# Patient Record
Sex: Female | Born: 1980 | Race: White | Hispanic: No | Marital: Married | Smoking: Current every day smoker
Health system: Southern US, Community
[De-identification: ages and names within clinical notes are randomized; demographics above are authoritative.]

## PROBLEM LIST (undated history)

## (undated) DIAGNOSIS — K219 Gastro-esophageal reflux disease without esophagitis: Secondary | ICD-10-CM

## (undated) DIAGNOSIS — F319 Bipolar disorder, unspecified: Secondary | ICD-10-CM

## (undated) DIAGNOSIS — F909 Attention-deficit hyperactivity disorder, unspecified type: Secondary | ICD-10-CM

## (undated) DIAGNOSIS — F419 Anxiety disorder, unspecified: Secondary | ICD-10-CM

## (undated) DIAGNOSIS — R519 Headache, unspecified: Secondary | ICD-10-CM

## (undated) DIAGNOSIS — G2581 Restless legs syndrome: Secondary | ICD-10-CM

## (undated) DIAGNOSIS — M1990 Unspecified osteoarthritis, unspecified site: Secondary | ICD-10-CM

## (undated) DIAGNOSIS — I10 Essential (primary) hypertension: Secondary | ICD-10-CM

## (undated) DIAGNOSIS — F603 Borderline personality disorder: Secondary | ICD-10-CM

## (undated) HISTORY — PX: ABDOMINAL HYSTERECTOMY: SHX81

## (undated) HISTORY — PX: CYST REMOVAL HAND: SHX6279

---

## 2004-10-02 ENCOUNTER — Emergency Department: Payer: Self-pay | Admitting: Emergency Medicine

## 2004-10-03 ENCOUNTER — Ambulatory Visit: Payer: Self-pay | Admitting: Emergency Medicine

## 2006-07-24 ENCOUNTER — Ambulatory Visit: Payer: Self-pay | Admitting: Surgery

## 2006-07-30 ENCOUNTER — Ambulatory Visit: Payer: Self-pay | Admitting: Specialist

## 2009-01-02 ENCOUNTER — Emergency Department (HOSPITAL_COMMUNITY): Admission: EM | Admit: 2009-01-02 | Discharge: 2009-01-02 | Payer: Self-pay | Admitting: Emergency Medicine

## 2009-02-01 ENCOUNTER — Inpatient Hospital Stay: Payer: Self-pay | Admitting: Unknown Physician Specialty

## 2010-02-07 ENCOUNTER — Observation Stay: Payer: Self-pay | Admitting: Obstetrics and Gynecology

## 2010-02-27 ENCOUNTER — Observation Stay: Payer: Self-pay | Admitting: Obstetrics and Gynecology

## 2010-03-15 ENCOUNTER — Observation Stay: Payer: Self-pay | Admitting: Obstetrics and Gynecology

## 2010-03-22 ENCOUNTER — Ambulatory Visit: Payer: Self-pay | Admitting: Obstetrics and Gynecology

## 2010-04-04 ENCOUNTER — Inpatient Hospital Stay: Payer: Self-pay

## 2010-04-09 LAB — PATHOLOGY REPORT

## 2011-06-12 ENCOUNTER — Ambulatory Visit: Payer: Self-pay | Admitting: Obstetrics and Gynecology

## 2011-06-12 LAB — HEMOGLOBIN: HGB: 14.6 g/dL (ref 12.0–16.0)

## 2011-06-12 LAB — BASIC METABOLIC PANEL
BUN: 6 mg/dL — ABNORMAL LOW (ref 7–18)
Creatinine: 0.65 mg/dL (ref 0.60–1.30)
EGFR (African American): >60
EGFR (Non-African Amer.): >60
Glucose: 93 mg/dL (ref 65–99)
Sodium: 140 mmol/L (ref 136–145)

## 2011-06-20 ENCOUNTER — Ambulatory Visit: Payer: Self-pay | Admitting: Obstetrics and Gynecology

## 2011-06-21 LAB — BASIC METABOLIC PANEL
EGFR (African American): >60
EGFR (Non-African Amer.): >60
Glucose: 89 mg/dL (ref 65–99)
Potassium: 3.6 mmol/L (ref 3.5–5.1)
Sodium: 137 mmol/L (ref 136–145)

## 2012-01-08 ENCOUNTER — Emergency Department: Payer: Self-pay | Admitting: Emergency Medicine

## 2012-08-25 ENCOUNTER — Emergency Department: Payer: Self-pay | Admitting: Emergency Medicine

## 2013-02-18 ENCOUNTER — Emergency Department: Payer: Self-pay | Admitting: Emergency Medicine

## 2013-09-14 ENCOUNTER — Emergency Department: Payer: Self-pay | Admitting: Emergency Medicine

## 2014-02-06 ENCOUNTER — Emergency Department: Payer: Self-pay | Admitting: Emergency Medicine

## 2014-02-18 LAB — CBC WITH DIFFERENTIAL/PLATELET
BASOS ABS: 0 10*3/uL (ref 0.0–0.1)
Basophil %: 0.1 %
EOS ABS: 0 10*3/uL (ref 0.0–0.7)
EOS PCT: 0.1 %
HCT: 37.3 % (ref 35.0–47.0)
HGB: 12.5 g/dL (ref 12.0–16.0)
LYMPHS ABS: 0.8 10*3/uL — AB (ref 1.0–3.6)
LYMPHS PCT: 4.5 %
MCH: 29.7 pg (ref 26.0–34.0)
MCHC: 33.5 g/dL (ref 32.0–36.0)
MCV: 89 fL (ref 80–100)
MONO ABS: 2 x10 3/mm — AB (ref 0.2–0.9)
MONOS PCT: 11.2 %
NEUTROS ABS: 15.2 10*3/uL — AB (ref 1.4–6.5)
Neutrophil %: 84.1 %
PLATELETS: 147 10*3/uL — AB (ref 150–440)
RBC: 4.2 10*6/uL (ref 3.80–5.20)
RDW: 13.7 % (ref 11.5–14.5)
WBC: 18.1 10*3/uL — AB (ref 3.6–11.0)

## 2014-02-18 LAB — COMPREHENSIVE METABOLIC PANEL
ALBUMIN: 3.3 g/dL — AB (ref 3.4–5.0)
ANION GAP: 7 (ref 7–16)
AST: 13 U/L — AB (ref 15–37)
Alkaline Phosphatase: 86 U/L
BILIRUBIN TOTAL: 0.8 mg/dL (ref 0.2–1.0)
BUN: 7 mg/dL (ref 7–18)
CALCIUM: 8.8 mg/dL (ref 8.5–10.1)
CREATININE: 0.77 mg/dL (ref 0.60–1.30)
Chloride: 103 mmol/L (ref 98–107)
Co2: 28 mmol/L (ref 21–32)
EGFR (African American): >60
EGFR (Non-African Amer.): >60
Glucose: 126 mg/dL — ABNORMAL HIGH (ref 65–99)
Osmolality: 275 (ref 275–301)
POTASSIUM: 3.5 mmol/L (ref 3.5–5.1)
SGPT (ALT): 18 U/L
Sodium: 138 mmol/L (ref 136–145)
TOTAL PROTEIN: 7.6 g/dL (ref 6.4–8.2)

## 2014-02-19 ENCOUNTER — Inpatient Hospital Stay: Payer: Self-pay | Admitting: Internal Medicine

## 2014-02-19 LAB — URINALYSIS, COMPLETE
BILIRUBIN, UR: NEGATIVE
Glucose,UR: NEGATIVE mg/dL (ref 0–75)
Ketone: NEGATIVE
NITRITE: POSITIVE
PH: 6 (ref 4.5–8.0)
Protein: 100
Specific Gravity: 1.01 (ref 1.003–1.030)
Squamous Epithelial: 5

## 2014-02-20 LAB — BASIC METABOLIC PANEL
Anion Gap: 7 (ref 7–16)
BUN: 7 mg/dL (ref 7–18)
CALCIUM: 8.4 mg/dL — AB (ref 8.5–10.1)
CHLORIDE: 106 mmol/L (ref 98–107)
CREATININE: 0.64 mg/dL (ref 0.60–1.30)
Co2: 30 mmol/L (ref 21–32)
EGFR (Non-African Amer.): >60
GLUCOSE: 90 mg/dL (ref 65–99)
OSMOLALITY: 282 (ref 275–301)
POTASSIUM: 3.4 mmol/L — AB (ref 3.5–5.1)
Sodium: 143 mmol/L (ref 136–145)

## 2014-02-20 LAB — CBC WITH DIFFERENTIAL/PLATELET
BASOS PCT: 0.2 %
Basophil #: 0 10*3/uL (ref 0.0–0.1)
Eosinophil #: 0 10*3/uL (ref 0.0–0.7)
Eosinophil %: 0.2 %
HCT: 31.1 % — ABNORMAL LOW (ref 35.0–47.0)
HGB: 10.7 g/dL — ABNORMAL LOW (ref 12.0–16.0)
LYMPHS ABS: 1.4 10*3/uL (ref 1.0–3.6)
Lymphocyte %: 11.1 %
MCH: 30.7 pg (ref 26.0–34.0)
MCHC: 34.4 g/dL (ref 32.0–36.0)
MCV: 89 fL (ref 80–100)
MONO ABS: 2.2 x10 3/mm — AB (ref 0.2–0.9)
Monocyte %: 17.4 %
NEUTROS ABS: 8.9 10*3/uL — AB (ref 1.4–6.5)
Neutrophil %: 71.1 %
PLATELETS: 124 10*3/uL — AB (ref 150–440)
RBC: 3.48 10*6/uL — ABNORMAL LOW (ref 3.80–5.20)
RDW: 13.2 % (ref 11.5–14.5)
WBC: 12.5 10*3/uL — ABNORMAL HIGH (ref 3.6–11.0)

## 2014-02-21 LAB — BASIC METABOLIC PANEL
Anion Gap: 8 (ref 7–16)
BUN: 5 mg/dL — AB (ref 7–18)
CREATININE: 0.62 mg/dL (ref 0.60–1.30)
Calcium, Total: 8.2 mg/dL — ABNORMAL LOW (ref 8.5–10.1)
Chloride: 107 mmol/L (ref 98–107)
Co2: 28 mmol/L (ref 21–32)
EGFR (African American): >60
EGFR (Non-African Amer.): >60
Glucose: 85 mg/dL (ref 65–99)
Osmolality: 281 (ref 275–301)
Potassium: 3.8 mmol/L (ref 3.5–5.1)
Sodium: 143 mmol/L (ref 136–145)

## 2014-02-21 LAB — CBC WITH DIFFERENTIAL/PLATELET
BASOS PCT: 0.5 %
Basophil #: 0 10*3/uL (ref 0.0–0.1)
EOS PCT: 1.8 %
Eosinophil #: 0.1 10*3/uL (ref 0.0–0.7)
HCT: 30.2 % — AB (ref 35.0–47.0)
HGB: 10.1 g/dL — ABNORMAL LOW (ref 12.0–16.0)
LYMPHS PCT: 22 %
Lymphocyte #: 1.6 10*3/uL (ref 1.0–3.6)
MCH: 30.1 pg (ref 26.0–34.0)
MCHC: 33.5 g/dL (ref 32.0–36.0)
MCV: 90 fL (ref 80–100)
Monocyte #: 1.4 x10 3/mm — ABNORMAL HIGH (ref 0.2–0.9)
Monocyte %: 19.5 %
NEUTROS ABS: 4.1 10*3/uL (ref 1.4–6.5)
NEUTROS PCT: 56.2 %
Platelet: 131 10*3/uL — ABNORMAL LOW (ref 150–440)
RBC: 3.36 10*6/uL — AB (ref 3.80–5.20)
RDW: 13.3 % (ref 11.5–14.5)
WBC: 7.3 10*3/uL (ref 3.6–11.0)

## 2014-02-21 LAB — URINE CULTURE

## 2014-02-21 LAB — MAGNESIUM: MAGNESIUM: 2.1 mg/dL

## 2014-02-24 LAB — CULTURE, BLOOD (SINGLE)

## 2014-04-10 ENCOUNTER — Emergency Department: Payer: Self-pay | Admitting: Emergency Medicine

## 2014-07-29 NOTE — H&P (Signed)
PATIENT NAME:  Hayley Smith, Hayley Smith MR#:  [id number] DATE OF BIRTH:  Apr 29, 1980  DATE OF ADMISSION:  02/19/2014  REFERRING PHYSICIAN: Sharyn Creamer, MD  PRIMARY CARE PHYSICIAN: None local.   ADMISSION DIAGNOSIS: Pyelonephritis.   HISTORY OF PRESENT ILLNESS: This is a 34 year old Caucasian female who presents to the Emergency Department complaining of pain and fever to 105 degrees Fahrenheit. The patient states that she has felt nauseous and has been feeling as if she might be developing urinary tract infection for the last 2 weeks. The pain just became too great to bear which brought her to the Emergency Department, where she was found to have a gross pyuria as well as fever, which prompted the Emergency Department to call for admission.   REVIEW OF SYSTEMS: CONSTITUTIONAL: The patient admits to fever, but denies weakness.  EYES: Denies blurred vision or inflammation.  EARS, NOSE AND THROAT: Denies tinnitus or difficulty swallowing.  RESPIRATORY: Denies cough or shortness of breath.  CARDIOVASCULAR: Denies chest pain or palpitations.  GASTROINTESTINAL: Admits to nausea but denies vomiting or diarrhea. GENITOURINARY: Admits to dysuria, increased frequency and hesitancy of urination.  ENDOCRINE: Denies polyuria or polydipsia.  HEMATOLOGIC AND LYMPHATIC: Denies easy bruising or bleeding.  INTEGUMENT: Denies rashes or lesions.  MUSCULOSKELETAL: Denies myalgias or arthralgias.  NEUROLOGIC: Denies numbness in extremities or dysarthria.  PSYCHIATRIC: Admits to depression or more specifically bipolar disorder, but denies suicidal ideation.   PAST MEDICAL HISTORY: Bipolar disorder and history of cervical cancer.   PAST SURGICAL HISTORY: Hysterectomy.   FAMILY HISTORY: Kidney stones of  "the usual type",  hypertension and diabetes type 2.   SOCIAL HISTORY: The patient is a 20 pack-year smoker. She does not drink or do any drugs.   MEDICATIONS: 1. Depakote 500 mg delayed-release tablet, 1 tablet p.o.  b.i.d.  2. Klonopin 0.5 mg 1 tab p.o. daily.  3. Spiriva 300 mg 1 tablet p.o. daily.   ALLERGIES: PENICILLIN.   PERTINENT LABORATORY RESULTS AND RADIOGRAPHIC FINDINGS: BUN 7, serum glucose 126, creatinine 0.77, sodium is 138, potassium 3.5, chloride is 103, bicarbonate 28, calcium is 8.8, alkaline phosphatase 86. AST 13, ALT 18. White blood cell count is 18.1, hemoglobin is 12.5, hematocrit 37.3. Urinalysis shows positive nitrites, 3+ leukocyte esterase and 981 white blood cells per high-power field with mucus and 2+ bacteria present.   PHYSICAL EXAMINATION: VITAL SIGNS: Temperature is 102.3, pulse 123, respirations 24, blood pressure 136/72, pulse oximetry 100% on room air.  GENERAL: The patient is alert and oriented x3. She is in no apparent distress.  HEENT: Normocephalic, atraumatic. Pupils equal, round, and reactive to light and accommodation. Extraocular movements are intact. Mucous membranes are moist.  NECK: Trachea is midline. No adenopathy.  CHEST: Symmetric, atraumatic.  CARDIOVASCULAR: Tachycardic rate, normal rhythm. Normal S1, S2. No rubs, clicks, or murmurs appreciated.  LUNGS: Clear to auscultation bilaterally. Normal effort and excursion.  ABDOMEN: Positive bowel sounds. Soft. Generally nontender but slightly tense suprapubically without guarding or rebound. There is no hepatosplenomegaly.  GENITOURINARY: Deferred.  MUSCULOSKELETAL: The patient moves all 4 extremities equally. There is 5/5 strength in upper and lower extremities bilaterally.  SKIN: No rashes or lesions.  EXTREMITIES: No clubbing, cyanosis, or edema.  NEUROLOGIC: Cranial nerves II through XII grossly intact.  PSYCHIATRIC: Mood is currently normal. Affect is congruent.   ASSESSMENT AND PLAN: This is a 34 year old female admitted for pyelonephritis.  1. Pyelonephritis. The patient is febrile. She has been nauseous before receiving antiemetics and she does have positive costovertebral angle  tenderness. She was  given ceftriaxone in the Emergency Department, but I will schedule ciprofloxacin IV to start in the morning as this is  first line treatment for pyelonephritis.  2. Sepsis. The patient meets criteria by fever, leukocytosis, heart rate and respiratory rate. Blood cultures and urine cultures have been obtained. We will continue with IV antibiotics and adjust per sensitivities if needed.  3. Obesity. The patient's BMI is 30.9. I encouraged her to eat a balanced diet and exercise especially as she takes psychiatric medications, which can lead to metabolic syndrome.  4. Tobacco abuse. I will prescribe Nicoderm patch while the patient is in the hospital and provide her with cessation counseling.  5. Deep vein thrombosis prophylaxis, heparin.  6. Gastrointestinal prophylaxis not needed as the patient is not critically ill.   CODE STATUS: The patient is a full code.   TIME SPENT ON ADMISSION ORDERS AND PATIENT CARE: 30 minutes.    ____________________________ Kelton PillarMichael S. Sheryle Hailiamond, MD msd:dw D: 02/19/2014 03:33:41 ET T: 02/19/2014 07:50:01 ET JOB#: [id number]  cc: Kelton PillarMichael S. Sheryle Hailiamond, MD, <Dictator> Kelton PillarMICHAEL S Kaidyn Javid MD ELECTRONICALLY SIGNED 03/10/2014 8:06

## 2014-07-29 NOTE — Discharge Summary (Signed)
PATIENT NAME:  Hayley Smith, Hayley Smith MR#:  [id number] DATE OF BIRTH:  02/13/1981  DATE OF ADMISSION:  02/19/2014 DATE OF DISCHARGE:  02/22/2014  DISCHARGE DIAGNOSES: 1.  Sepsis.  2.  Acute pyelonephritis due to Escherichia coli urinary infection.   SECONDARY DIAGNOSES: 1.  Bipolar disorder.  2.  History of cervical cancer.   CONSULTATIONS: None.   PROCEDURES AND RADIOLOGY: None.   MAJOR LABORATORY PANEL: UA on admission showed 2+ bacteria, 981 WBCs, 3+ leukocyte esterase, positive nitrite and WBC in clumps.   Urine culture grew more than 100,000 colonies of E. coli.   Blood cultures x2 were negative.   HISTORY AND SHORT HOSPITAL COURSE: The patient is a 34 year old female with the above-mentioned medical problems who was admitted for sepsis due to acute pyelonephritis. Please see Dr. Casimiro NeedleMichael Diamond's dictated history and physical for further details. The patient's urine was growing E. coli. Based on that culture sensitivity, her antibiotic was switched. She was gradually getting better, was feeling much better. By the 18th of November was able to tolerate the diet. Her pain was much better controlled and she was discharged home. She was afebrile for almost 48 hours.  PERTINENT DISCHARGE PHYSICAL EXAMINATION: VITAL SIGNS: On the date of discharge, temperature 98.4, heart rate 89 per minute, respirations 17 per minute, blood pressure 100/65, and she was saturating 96% on room air.  CARDIOVASCULAR: S1, S2 normal. No murmurs, rubs, or gallops.  LUNGS: Clear to auscultation bilaterally. No wheezing, rales, rhonchi, or crepitation.  ABDOMEN: Soft, benign.  NEUROLOGIC: Nonfocal examination. All other physical examination remained at baseline.   DISCHARGE MEDICATIONS: 1.  Klonopin 0.5 mg p.o. daily.  2.  Seroquel 300 mg p.o. daily.  3.  Depakote 500 mg p.o. b.i.Smith.  4.  Levaquin 500 mg p.o. daily for 7 days. 5.  Tylenol 650 mg p.o. every 8 hours for 5 to 7 days as needed.   DISCHARGE DIET:  Regular.   DISCHARGE ACTIVITY: As tolerated.   DISCHARGE INSTRUCTIONS AND FOLLOW-UP: The patient was [facility] 1 to 2 weeks.   TOTAL TIME DISCHARGING THIS PATIENT: 40 minutes.  ____________________________ Ellamae SiaVipul S. Sherryll BurgerShah, MD vss:sb Smith: 02/23/2014 11:53:51 ET T: 02/23/2014 16:53:43 ET JOB#: [id number]  cc: Thurza Kwiecinski S. Sherryll BurgerShah, MD, <Dictator> [facility] Ellamae SiaVIPUL S [facility] MD ELECTRONICALLY SIGNED 03/01/2014 15:06

## 2014-07-30 NOTE — Discharge Summary (Signed)
PATIENT NAME:  Hayley Smith, Rosamary D MR#:  [id number] DATE OF BIRTH:  02/02/1981  DATE OF ADMISSION:  06/20/2011 DATE OF DISCHARGE:  06/21/2011  HOSPITAL COURSE: The patient underwent uncomplicated total vaginal hysterectomy. Postoperative day #1 the patient was using a PCA for pain relief. Hematocrit postoperative day #1 was 34.9%, BUN and creatinine of 6 and 0.53. Good urine output. The patient's pain is stable. She is tolerating regular food, urinating without difficulty. The patient will be discharged to home in good condition. Will follow-up with Dr. Feliberto GottronSchermerhorn [facility] or before if she has heavy vaginal bleeding, fever, nausea, or vomiting, increasing abdominal pain.   DISCHARGE MEDICATIONS:  1. Percocet 7/325, 1 to 2 tablets every 4 to 6 hours. 2. Naprosyn 500 mg twice a day. 3. Colace 100 mg daily.   FOLLOWUP: The patient wall undergo pelvic rest for the next 30 days. Also instructed not to engage in any heavy lifting.    ____________________________ Suzy Bouchardhomas J. Schermerhorn, MD tjs:ap D: 06/21/2011 10:17:07 ET T: 06/21/2011 15:15:42 ET JOB#: [id number]  cc: Suzy Bouchardhomas J. Schermerhorn, MD, <Dictator> Suzy BouchardHOMAS J SCHERMERHORN MD ELECTRONICALLY SIGNED 06/22/2011 7:57

## 2014-07-30 NOTE — Op Note (Signed)
PATIENT NAME:  Hayley Smith, Hayley Smith MR#:  [id number] DATE OF BIRTH:  04/14/1980  DATE OF PROCEDURE:  06/20/2011  PREOPERATIVE DIAGNOSES:  1. Menorrhagia.  2. Dysmenorrhea.  3. Dyspareunia.   POSTOPERATIVE DIAGNOSES:  1. Menorrhagia.  2. Dysmenorrhea.  3. Dyspareunia.   PROCEDURE: Total vaginal hysterectomy.   SURGEON: Suzy Bouchardhomas J. Marx Doig, MD  FIRST ASSISTANT: Dr. Logan BoresEvans  ANESTHESIA: General endotracheal anesthesia.  INDICATIONS: A 34 year old gravida 3, para 3 patient with a long history of menorrhagia, dyspareunia and dysmenorrhea. Patient had a negative endometrial biopsy and saline infusion sonohysterography work-up in the office.   DESCRIPTION OF PROCEDURE: After adequate general endotracheal anesthesia, patient was placed in dorsal supine position, legs placed in the candy cane stirrups. Patient did receive 2 grams IV cefoxitin prior to commencement of the case. Patient's abdomen, perineum and vagina were prepped with Betadine. The patient was then draped in normal sterile fashion. Red Robinson catheter placed into the bladder yielding 150 mL clear urine. A weighted speculum was placed in the posterior vaginal vault and the cervix was grasped with two thyroid tenacula. Cervix was then circumferentially injected with 1% lidocaine with 1:100,000 epinephrine. A direct posterior colpotomy incision was made. Upon entry into the posterior cul-de-sac, long-billed weighted speculum was placed. The uterosacral ligaments were bilaterally clamped, transected, suture ligated with 0 Vicryl suture. Circumferential incision was made anteriorly and the anterior cul-de-sac was entered sharply without difficulty. Cardinal ligaments were bilaterally clamped, transected and suture ligated with 0 Vicryl suture. Uterine arteries were bilaterally clamped, transected and sutured with 0 Vicryl suture. Broad ligaments were sequentially clamped, transected, suture ligated with 0 Vicryl suture and the uterine cornua  were then bilaterally clamped. The uterus was then removed intact and the cornual pedicles were doubly ligated with 0 Vicryl suture. Good hemostasis noted. Ovaries appeared normal bilaterally. The peritoneum was then closed with 2-0 PDS in a pursestring fashion, again good hemostasis noted and the vaginal vault was closed with a running 0 Vicryl suture. Uterosacral ligaments were plicated centrally and the rest of the vaginal vault closed with 0 Vicryl suture. Good hemostasis again noted. Foley catheter placed at the end of the case yielding clear urine. There were no complications. Estimated blood loss 75 mL. Intraoperative fluids 1300 mL. Urine output 250 mL prior to the case and additional 100 postoperatively. Patient was taken to recovery room in good condition.    ____________________________ Suzy Bouchardhomas J. Jacqualynn Parco, MD tjs:cms Smith: 06/20/2011 09:27:36 ET T: 06/20/2011 10:12:52 ET JOB#: [id number]  cc: Suzy Bouchardhomas J. Jahaira Earnhart, MD, <Dictator>  Suzy BouchardHOMAS J Sanii Kukla MD ELECTRONICALLY SIGNED 06/21/2011 11:05

## 2015-03-02 ENCOUNTER — Encounter: Payer: Self-pay | Admitting: Emergency Medicine

## 2015-03-02 ENCOUNTER — Emergency Department
Admission: EM | Admit: 2015-03-02 | Discharge: 2015-03-02 | Disposition: A | Discharge status: Home / Self Care | Payer: Medicaid Other | Attending: Emergency Medicine | Admitting: Emergency Medicine

## 2015-03-02 DIAGNOSIS — Y9289 Other specified places as the place of occurrence of the external cause: Secondary | ICD-10-CM | POA: Insufficient documentation

## 2015-03-02 DIAGNOSIS — Y9389 Activity, other specified: Secondary | ICD-10-CM | POA: Diagnosis not present

## 2015-03-02 DIAGNOSIS — S61412A Laceration without foreign body of left hand, initial encounter: Secondary | ICD-10-CM | POA: Insufficient documentation

## 2015-03-02 DIAGNOSIS — Y998 Other external cause status: Secondary | ICD-10-CM | POA: Diagnosis not present

## 2015-03-02 DIAGNOSIS — Z880 Allergy status to penicillin: Secondary | ICD-10-CM | POA: Insufficient documentation

## 2015-03-02 DIAGNOSIS — Z23 Encounter for immunization: Secondary | ICD-10-CM | POA: Diagnosis not present

## 2015-03-02 DIAGNOSIS — Y281XXA Contact with knife, undetermined intent, initial encounter: Secondary | ICD-10-CM | POA: Insufficient documentation

## 2015-03-02 DIAGNOSIS — F17200 Nicotine dependence, unspecified, uncomplicated: Secondary | ICD-10-CM | POA: Insufficient documentation

## 2015-03-02 MED ORDER — LIDOCAINE HCL (PF) 1 % IJ SOLN
INTRAMUSCULAR | Status: AC
  Filled 2015-03-02: qty 5

## 2015-03-02 MED ORDER — LIDOCAINE HCL (PF) 1 % IJ SOLN
2.0000 mL | Freq: Once | INTRAMUSCULAR | Status: AC
  Administered 2015-03-02: 2 mL via INTRADERMAL

## 2015-03-02 MED ORDER — TETANUS-DIPHTHERIA TOXOIDS TD 5-2 LFU IM INJ
INJECTION | INTRAMUSCULAR | Status: AC
  Filled 2015-03-02: qty 0.5

## 2015-03-02 MED ORDER — ACETAMINOPHEN 325 MG PO TABS
650.0000 mg | ORAL_TABLET | Freq: Once | ORAL | Status: AC
  Administered 2015-03-02: 650 mg via ORAL

## 2015-03-02 MED ORDER — TETANUS-DIPHTH-ACELL PERTUSSIS 5-2.5-18.5 LF-MCG/0.5 IM SUSP
0.5000 mL | Freq: Once | INTRAMUSCULAR | Status: AC
  Administered 2015-03-02: 0.5 mL via INTRAMUSCULAR

## 2015-03-02 MED ORDER — ACETAMINOPHEN 325 MG PO TABS
ORAL_TABLET | ORAL | Status: AC
  Filled 2015-03-02: qty 2

## 2015-03-02 NOTE — ED Provider Notes (Signed)
[facility] Emergency Department Provider Note  ____________________________________________  Time seen: On arrival  I have reviewed the triage vital signs and the nursing notes.   HISTORY  Chief Complaint Laceration    HPI Hayley Smith is a 34 y.o. female who presents with a laceration to the left hand. She dropped a knife while washing dishes today and suffered a superficial laceration to the palm of her left hand. She reports full motion of all her fingers. 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 Her tetanus shot is out of date    No past medical history on file.  There are no active problems to display for this patient.   No past surgical history on file.  No current outpatient prescriptions on file.  Allergies Penicillins  No family history on file.  Social History Social History  Substance Use Topics  . Smoking status: Current Every Day Smoker  . Smokeless tobacco: None  . Alcohol Use: No    Review of Systems  Constitutional: Negative for fever.     Musculoskeletal: Positive for hand pain Skin: Positive for laceration Neurological: Negative for  focal weakness or loss of sensation   ____________________________________________   PHYSICAL EXAM:  VITAL SIGNS: ED Triage Vitals  Enc Vitals Group     BP 03/02/15 1427 122/69 mmHg     Pulse Rate 03/02/15 1427 98     Resp 03/02/15 1427 14     Temp 03/02/15 1427 98 F (36.7 C)     Temp Source 03/02/15 1427 Oral     SpO2 03/02/15 1427 99 %     Weight 03/02/15 1427 220 lb (99.791 kg)     Height 03/02/15 1427 5\' 4"  (1.626 m)     Head Cir --      Peak Flow --      Pain Score 03/02/15 1428 8     Pain Loc --      Pain Edu? --      Excl. in GC? --      Constitutional: Alert and oriented. Well appearing and in no distress. Anxious Eyes: Conjunctivae are normal.  ENT   Head: Normocephalic and atraumatic.   Mouth/Throat: Mucous membranes  are moist.   Gastrointestinal: Soft and non-tender in all quadrants. No distention. There is no CVA tenderness. Musculoskeletal: Nontender with normal range of motion in all extremities. Tendon function is intact in the left hand. Superficial horizontal laceration on the palmar surface of the hand approximately 2.5 cm long, no bleeding. Neurologic:  Normal speech and language. Normal strength and movement of the fingers. Skin:  Skin is warm, dry . Laceration is above Psychiatric: Mood and affect are normal. Patient exhibits appropriate insight and judgment.  ____________________________________________    LABS (pertinent positives/negatives)  Labs Reviewed - No data to display  ____________________________________________     ____________________________________________    RADIOLOGY I have personally reviewed any xrays that were ordered on this patient: None  ____________________________________________   PROCEDURES  Procedure(s) performed: yes  LACERATION REPAIR Performed by: Jene EveryKINNER, Emett Stapel Authorized by: Jene EveryKINNER, Rosann Gorum Consent: Verbal consent obtained. Risks and benefits: risks, benefits and alternatives were discussed Consent given by: patient Patient identity confirmed: provided demographic data Prepped and Draped in normal sterile fashion Wound explored  Laceration Location: left hand  Laceration Length: 2.5 cm  No Foreign Bodies seen or palpated  Anesthesia: local infiltration    Irrigation method: sink Amount of cleaning: standard  Skin closure: skin glue  Patient tolerance: Patient tolerated the procedure well with no immediate complications.    ____________________________________________   INITIAL IMPRESSION / ASSESSMENT AND PLAN / ED COURSE  Pertinent labs & imaging results that were available during my care of the patient were reviewed by me and considered in my medical decision making (see chart for details).  Tetanus updated.  Laceration repaired with Dermabond.  ____________________________________________   FINAL CLINICAL IMPRESSION(S) / ED DIAGNOSES  Final diagnoses:  Laceration of hand, left, initial encounter     Jene Every, MD 03/02/15 [phone]

## 2015-03-02 NOTE — ED Notes (Signed)
Pt was washing dishes today and dropped a knife in the process of trying to catch it lacerated her left hand. Also states that she is having arthritis pain in her knees which she rates at a 10.

## 2015-03-02 NOTE — Discharge Instructions (Signed)
Nonsutured Laceration Care °A laceration is a cut that goes through all layers of the skin and extends into the tissue that is right under the skin. This type of cut is usually stitched up (sutured) or closed with tape (adhesive strips) or skin glue shortly after the injury happens. °However, if the wound is dirty or if several hours pass before medical treatment is provided, it is likely that germs (bacteria) will enter the wound. Closing a laceration after bacteria have entered it increases the risk of infection. In these cases, your health care provider may leave the laceration open (nonsutured) and cover it with a bandage. This type of treatment helps prevent infection and allows the wound to heal from the deepest layer of tissue damage up to the surface. °An open fracture is a type of injury that may involve nonsutured lacerations. An open fracture is a break in a bone that happens along with one or more lacerations through the skin that is near the fracture site. °HOW TO CARE FOR YOUR NONSUTURED LACERATION °· Take or apply over-the-counter and prescription medicines only as told by your health care provider. °· If you were prescribed an antibiotic medicine, take or apply it as told by your health care provider. Do not stop using the antibiotic even if your condition improves. °· Clean the wound one time each day or as told by your health care provider. °¨ Wash the wound with mild soap and water. °¨ Rinse the wound with water to remove all soap. °¨ Pat your wound dry with a clean towel. Do not rub the wound. °· Do not inject anything into the wound unless your health care provider told you to. °· Change any bandages (dressings) as told by your health care provider. This includes changing the dressing if it gets wet, dirty, or starts to smell bad. °· Keep the dressing dry until your health care provider says it can be removed. Do not take baths, swim, or do anything that puts your wound underwater until your  health care provider approves. °· Raise (elevate) the injured area above the level of your heart while you are sitting or lying down, if possible. °· Do not scratch or pick at the wound. °· Check your wound every day for signs of infection. Watch for: °¨ Redness, swelling, or pain. °¨ Fluid, blood, or pus. °· Keep all follow-up visits as told by your health care provider. This is important. °SEEK MEDICAL CARE IF: °· You received a tetanus and shot and you have swelling, severe pain, redness, or bleeding at the injection site.   °· You have a fever. °· Your pain is not controlled with medicine. °· You have increased redness, swelling, or pain at the site of your wound. °· You have fluid, blood, or pus coming from your wound. °· You notice a bad smell coming from your wound or your dressing. °· You notice something coming out of the wound, such as wood or glass. °· You notice a change in the color of your skin near your wound. °· You develop a new rash. °· You need to change the dressing frequently due to fluid, blood, or pus draining from the wound. °· You develop numbness around your wound. °SEEK IMMEDIATE MEDICAL CARE IF: °· Your pain suddenly increases and is severe. °· You develop severe swelling around the wound. °· The wound is on your hand or foot and you cannot properly move a finger or toe. °· The wound is on your hand or   foot and you notice that your fingers or toes look pale or bluish. °· You have a red streak going away from your wound. °  °This information is not intended to replace advice given to you by your health care provider. Make sure you discuss any questions you have with your health care provider. °  °Document Released: 02/19/2006 Document Revised: 08/08/2014 Document Reviewed: 03/20/2014 °Elsevier Interactive Patient Education ©2016 [employer] ° °

## 2015-03-02 NOTE — ED Notes (Signed)
Patient caught a knife when it fell and lace to left hand

## 2015-06-28 ENCOUNTER — Encounter: Payer: Self-pay | Admitting: Emergency Medicine

## 2015-06-28 ENCOUNTER — Emergency Department
Admission: EM | Admit: 2015-06-28 | Discharge: 2015-06-28 | Disposition: A | Discharge status: Home / Self Care | Payer: Medicaid Other | Attending: Emergency Medicine | Admitting: Emergency Medicine

## 2015-06-28 ENCOUNTER — Emergency Department: Payer: Medicaid Other

## 2015-06-28 DIAGNOSIS — Z90710 Acquired absence of both cervix and uterus: Secondary | ICD-10-CM | POA: Insufficient documentation

## 2015-06-28 DIAGNOSIS — Y999 Unspecified external cause status: Secondary | ICD-10-CM | POA: Diagnosis not present

## 2015-06-28 DIAGNOSIS — S60222A Contusion of left hand, initial encounter: Secondary | ICD-10-CM | POA: Diagnosis not present

## 2015-06-28 DIAGNOSIS — Y92009 Unspecified place in unspecified non-institutional (private) residence as the place of occurrence of the external cause: Secondary | ICD-10-CM | POA: Insufficient documentation

## 2015-06-28 DIAGNOSIS — F17210 Nicotine dependence, cigarettes, uncomplicated: Secondary | ICD-10-CM | POA: Diagnosis not present

## 2015-06-28 DIAGNOSIS — W19XXXA Unspecified fall, initial encounter: Secondary | ICD-10-CM | POA: Insufficient documentation

## 2015-06-28 DIAGNOSIS — M79642 Pain in left hand: Secondary | ICD-10-CM | POA: Diagnosis present

## 2015-06-28 DIAGNOSIS — Y939 Activity, unspecified: Secondary | ICD-10-CM | POA: Insufficient documentation

## 2015-06-28 MED ORDER — OXYCODONE-ACETAMINOPHEN 5-325 MG PO TABS: 1.0000 | ORAL_TABLET | ORAL | Status: DC | PRN

## 2015-06-28 MED ORDER — OXYCODONE-ACETAMINOPHEN 5-325 MG PO TABS
1.0000 | ORAL_TABLET | Freq: Once | ORAL | Status: AC
  Administered 2015-06-28: 1 via ORAL
  Filled 2015-06-28: qty 1

## 2015-06-28 NOTE — Discharge Instructions (Signed)
Hand Contusion ° A hand contusion is a deep bruise to the hand. Contusions happen when an injury causes bleeding under the skin. Signs of bruising include pain, puffiness (swelling), and discolored skin. The contusion may turn blue, purple, or yellow. °HOME CARE °· Put ice on the injured area. °¨ Put ice in a plastic bag. °¨ Place a towel between your skin and the bag. °¨ Leave the ice on for 15-20 minutes, 03-04 times a day. °· Only take medicines as told by your doctor. °· Use an elastic wrap only as told. You may remove the wrap for sleeping, showering, and bathing. Take the wrap off if you lose feeling (have numbness) in your fingers, or they turn blue or cold. Put the wrap on more loosely. °· Keep the hand raised (elevated) with pillows. °· Avoid using your hand too much if it is painful. °GET HELP RIGHT AWAY IF:  °· You have more redness, puffiness, or pain in your hand. °· Your puffiness or pain does not get better with medicine. °· You lose feeling in your hand, or you cannot move your fingers. °· Your hand turns cold or blue. °· You have pain when you move your fingers. °· Your hand feels warm. °· Your contusion does not get better in 2 days. °MAKE SURE YOU:  °· Understand these instructions. °· Will watch this condition. °· Will get help right away if you are not doing well or you get worse. °  °This information is not intended to replace advice given to you by your health care provider. Make sure you discuss any questions you have with your health care provider. °  °Document Released: 09/10/2007 Document Revised: 04/14/2014 Document Reviewed: 09/15/2011 °Elsevier Interactive Patient Education ©2016 [employer] ° °

## 2015-06-28 NOTE — ED Notes (Signed)
Pt presents to ED with complaints of left hand pain and left hand bruising after falling at home yesterday. Pt demonstrates movement of left hand. Bruising noted to top of left hand. Pt in NAD.

## 2015-06-28 NOTE — ED Notes (Signed)
See triage   Larey SeatFell   Having pain to left hand  Hand bruised and swollen

## 2015-06-28 NOTE — ED Provider Notes (Signed)
[facility] Emergency Department Provider Note  ____________________________________________  Time seen: Approximately 1:17 PM  I have reviewed the triage vital signs and the nursing notes.   HISTORY  Chief Complaint Hand Pain    HPI Hayley Smith is a 35 y.o. female patient complaining of left hand pain secondary to fall at home yesterday. Patient state pain is on the top part of her hand which increased with movement of the fingers. He denies any loss of sensation.She is rating the pain as a 10 over 10. Patient described the pain as "sharp". No palliative measures taken prior to arrival.   History reviewed. No pertinent past medical history.  There are no active problems to display for this patient.   Past Surgical History  Procedure Laterality Date  . Abdominal hysterectomy      Current Outpatient Rx  Name  Route  Sig  Dispense  Refill  . oxyCODONE-acetaminophen (ROXICET) 5-325 MG tablet   Oral   Take 1 tablet by mouth every 4 (four) hours as needed for severe pain.   30 tablet   0     Allergies Penicillins  No family history on file.  Social History Social History  Substance Use Topics  . Smoking status: Current Every Day Smoker -- 0.50 packs/day    Types: Cigarettes  . Smokeless tobacco: None  . Alcohol Use: No    Review of Systems Constitutional: No fever/chills Eyes: No visual changes. ENT: No sore throat. Cardiovascular: Denies chest pain. Respiratory: Denies shortness of breath. Gastrointestinal: No abdominal pain.  No nausea, no vomiting.  No diarrhea.  No constipation. Genitourinary: Negative for dysuria. Musculoskeletal: Negative for back pain. Skin: Negative for rash. Neurological: Negative for headaches, focal weakness or numbness. Allergic/Immunilogical: Penicillin  10-point ROS otherwise negative.  ____________________________________________   PHYSICAL EXAM:  VITAL SIGNS: ED Triage Vitals  Enc Vitals  Group     BP 06/28/15 1203 136/82 mmHg     Pulse Rate 06/28/15 1203 105     Resp 06/28/15 1203 18     Temp 06/28/15 1203 97.6 F (36.4 C)     Temp src --      SpO2 06/28/15 1203 98 %     Weight 06/28/15 1203 236 lb (107.049 kg)     Height 06/28/15 1203 5\' 5"  (1.651 m)     Head Cir --      Peak Flow --      Pain Score 06/28/15 1204 10     Pain Loc --      Pain Edu? --      Excl. in GC? --     Constitutional: Alert and oriented. Well appearing and in no acute distress. Eyes: Conjunctivae are normal. PERRL. EOMI. Head: Atraumatic. Nose: No congestion/rhinnorhea. Mouth/Throat: Mucous membranes are moist.  Oropharynx non-erythematous. Neck: No stridor.  No cervical spine tenderness to palpation. Hematological/Lymphatic/Immunilogical: No cervical lymphadenopathy. Cardiovascular: Normal rate, regular rhythm. Grossly normal heart sounds.  Good peripheral circulation. Respiratory: Normal respiratory effort.  No retractions. Lungs CTAB. Gastrointestinal: Soft and nontender. No distention. No abdominal bruits. No CVA tenderness. Musculoskeletal: Obvious deformity of the left hand. Moderate edema and ecchymosis to the dorsal aspect of the left hand. Neurologic:  Normal speech and language. No gross focal neurologic deficits are appreciated. No gait instability. Skin:  Skin is warm, dry and intact. No rash noted. Psychiatric: Mood and affect are normal. Speech and behavior are normal.  ____________________________________________   LABS (all labs ordered are listed, but only abnormal  results are displayed)  Labs Reviewed - No data to display ____________________________________________  EKG   ____________________________________________  RADIOLOGY  No acute findings on x-ray. ____________________________________________   PROCEDURES  Procedure(s) performed: None  Critical Care performed: No  ____________________________________________   INITIAL IMPRESSION / ASSESSMENT  AND PLAN / ED COURSE  Pertinent labs & imaging results that were available during my care of the patient were reviewed by me and considered in my medical decision making (see chart for details).  Left hand contusion. Discussed x-ray finding with patient. Patient placed in a volar splint and given Percocets for pain. Patient advised follow-up with family doctor if condition persists. ____________________________________________   FINAL CLINICAL IMPRESSION(S) / ED DIAGNOSES  Final diagnoses:  Hand contusion, left, initial encounter      Joni Reining, PA-C 06/28/15 1412

## 2015-08-23 ENCOUNTER — Emergency Department
Admission: EM | Admit: 2015-08-23 | Discharge: 2015-08-23 | Disposition: A | Discharge status: Home / Self Care | Payer: Medicaid Other | Attending: Emergency Medicine | Admitting: Emergency Medicine

## 2015-08-23 ENCOUNTER — Encounter: Payer: Self-pay | Admitting: Emergency Medicine

## 2015-08-23 ENCOUNTER — Emergency Department: Payer: Medicaid Other

## 2015-08-23 DIAGNOSIS — S61412A Laceration without foreign body of left hand, initial encounter: Secondary | ICD-10-CM | POA: Diagnosis not present

## 2015-08-23 DIAGNOSIS — Y999 Unspecified external cause status: Secondary | ICD-10-CM | POA: Insufficient documentation

## 2015-08-23 DIAGNOSIS — S60221A Contusion of right hand, initial encounter: Secondary | ICD-10-CM | POA: Diagnosis not present

## 2015-08-23 DIAGNOSIS — F17210 Nicotine dependence, cigarettes, uncomplicated: Secondary | ICD-10-CM | POA: Diagnosis not present

## 2015-08-23 DIAGNOSIS — Y929 Unspecified place or not applicable: Secondary | ICD-10-CM | POA: Diagnosis not present

## 2015-08-23 DIAGNOSIS — S6991XA Unspecified injury of right wrist, hand and finger(s), initial encounter: Secondary | ICD-10-CM | POA: Diagnosis present

## 2015-08-23 DIAGNOSIS — Y9389 Activity, other specified: Secondary | ICD-10-CM | POA: Diagnosis not present

## 2015-08-23 DIAGNOSIS — W260XXA Contact with knife, initial encounter: Secondary | ICD-10-CM | POA: Insufficient documentation

## 2015-08-23 MED ORDER — LIDOCAINE HCL (PF) 1 % IJ SOLN
INTRAMUSCULAR | Status: AC
  Filled 2015-08-23: qty 5

## 2015-08-23 MED ORDER — OXYCODONE-ACETAMINOPHEN 5-325 MG PO TABS
1.0000 | ORAL_TABLET | Freq: Once | ORAL | Status: AC
  Administered 2015-08-23: 1 via ORAL
  Filled 2015-08-23: qty 1

## 2015-08-23 MED ORDER — OXYCODONE-ACETAMINOPHEN 5-325 MG PO TABS: 1.0000 | ORAL_TABLET | Freq: Four times a day (QID) | ORAL | Status: DC | PRN

## 2015-08-23 NOTE — ED Notes (Signed)
Pt reports lac to left hand from washing knife. Pt also would like right hand looked at, reports she punched a wall last night.

## 2015-08-23 NOTE — ED Provider Notes (Signed)
[facility] Emergency Department Provider Note   ____________________________________________  Time seen: Approximately 2:49 PM  I have reviewed the triage vital signs and the nursing notes.   HISTORY  Chief Complaint Laceration and Hand Pain    HPI Hayley Smith is a 35 y.o. female patient, with laceration palmar left hand. Patient's status knife cut. Patient she dropped night and reached to catch it and cut her hand. Patient also has right hand pain secondary to punching a wall yesterday no recent given while she punched a wall. Bleeding is controlled with direct pressure. Patient denies any loss of sensation to the left hand. Patient state pain with flexion extension of the right hand. Patient patient rates the pain as a 9/10. No other palliative measures for this complaint. states tetanus is up-to-date.   History reviewed. No pertinent past medical history.  There are no active problems to display for this patient.   Past Surgical History  Procedure Laterality Date  . Abdominal hysterectomy      Current Outpatient Rx  Name  Route  Sig  Dispense  Refill  . oxyCODONE-acetaminophen (ROXICET) 5-325 MG tablet   Oral   Take 1 tablet by mouth every 4 (four) hours as needed for severe pain.   30 tablet   0   . oxyCODONE-acetaminophen (ROXICET) 5-325 MG tablet   Oral   Take 1 tablet by mouth every 6 (six) hours as needed for moderate pain.   12 tablet   0     Allergies Penicillins  No family history on file.  Social History Social History  Substance Use Topics  . Smoking status: Current Every Day Smoker -- 0.50 packs/day    Types: Cigarettes  . Smokeless tobacco: None  . Alcohol Use: No    Review of Systems Constitutional: No fever/chills Eyes: No visual changes. ENT: No sore throat. Cardiovascular: Denies chest pain. Respiratory: Denies shortness of breath. Gastrointestinal: No abdominal pain.  No nausea, no vomiting.  No  diarrhea.  No constipation. Genitourinary: Negative for dysuria. Musculoskeletal: Pain right hand Skin: Negative for rash. Laceration of hand Neurological: Negative for headaches, focal weakness or numbness. Allergic/Immunilogical: Penicillin ____________________________________________   PHYSICAL EXAM:  VITAL SIGNS: ED Triage Vitals  Enc Vitals Group     BP 08/23/15 1425 114/53 mmHg     Pulse Rate 08/23/15 1425 98     Resp 08/23/15 1425 16     Temp 08/23/15 1425 97.8 F (36.6 C)     Temp Source 08/23/15 1425 Oral     SpO2 08/23/15 1425 98 %     Weight 08/23/15 1425 230 lb (104.327 kg)     Height 08/23/15 1425  (1.626 m)     Head Cir --      Peak Flow --      Pain Score 08/23/15 1425 9     Pain Loc --      Pain Edu? --      Excl. in GC? --     Constitutional: Alert and oriented. Well appearing and in no acute distress. Eyes: Conjunctivae are normal. PERRL. EOMI. Head: Atraumatic. Nose: No congestion/rhinnorhea. Mouth/Throat: Mucous membranes are moist.  Oropharynx non-erythematous. Neck: No stridor.  No cervical spine tenderness to palpation. Hematological/Lymphatic/Immunilogical: No cervical lymphadenopathy. Cardiovascular: Normal rate, regular rhythm. Grossly normal heart sounds.  Good peripheral circulation. Respiratory: Normal respiratory effort.  No retractions. Lungs CTAB. Gastrointestinal: Soft and nontender. No distention. No abdominal bruits. No CVA tenderness. Musculoskeletal: No deformity to the  right hand. Some mild edema with guarding palpation to the third and fourth metacarpal. Neurologic:  Normal speech and language. No gross focal neurologic deficits are appreciated. No gait instability. Skin:  1 cm laceration to the left palm. Psychiatric: Mood and affect are normal. Speech and behavior are normal.  ____________________________________________   LABS (all labs ordered are listed, but only abnormal results are displayed)  Labs Reviewed - No data  to display ____________________________________________  EKG  ____________________________________________  RADIOLOGY  No acute findings x-ray right hand. I, Joni Reiningonald K Smith, personally viewed and evaluated these images (plain radiographs) as part of my medical decision making, as well as reviewing the written report by the radiologist.  ____________________________________________   PROCEDURES  Procedure(s) performed: LACERATION REPAIR Performed by: Joni Reiningonald K Smith Authorized by: Joni Reiningonald K Smith Consent: Verbal consent obtained. Risks and benefits: risks, benefits and alternatives were discussed Consent given by: patient Patient identity confirmed: provided demographic data Prepped and Draped in normal sterile fashion Wound explored  Laceration Location: left hand  Laceration Length: 1cm  No Foreign Bodies seen or palpated  Amount of cleaning: standard  Skin closure: 4-0 nylon  Number of sutures: 2 Technique: Interrupted Patient tolerance: Patient tolerated the procedure well with no immediate complications.   Critical Care performed: No  ____________________________________________   INITIAL IMPRESSION / ASSESSMENT AND PLAN / ED COURSE  Pertinent labs & imaging results that were available during my care of the patient were reviewed by me and considered in my medical decision making (see chart for details).  Laceration left hand. Right hand contusion. Discussed negative x-ray finding with patient. Patient given discharge care instructions were hand laceration and contusion. Advised follow-up family doctor as needed. ____________________________________________   FINAL CLINICAL IMPRESSION(S) / ED DIAGNOSES  Final diagnoses:  Hand laceration, left, initial encounter  Hand contusion, right, initial encounter      NEW MEDICATIONS STARTED DURING THIS VISIT:  New Prescriptions   OXYCODONE-ACETAMINOPHEN (ROXICET) 5-325 MG TABLET    Take 1 tablet by mouth every  6 (six) hours as needed for moderate pain.     Note:  This document was prepared using Dragon voice recognition software and may include unintentional dictation errors.    Joni Reiningonald K Smith, PA-C 08/23/15 1542  Jene Everyobert Kinner, MD 08/23/15 [phone]

## 2015-08-23 NOTE — ED Notes (Signed)
Presents with laceration to left palm of hand from a knife.. States she dropped the knife and tried to catch it.Marland Kitchen.also has swelling to right hand from punching a wall yesterday

## 2015-09-19 ENCOUNTER — Emergency Department
Admission: EM | Admit: 2015-09-19 | Discharge: 2015-09-19 | Disposition: A | Discharge status: Home / Self Care | Payer: Medicaid Other | Attending: Emergency Medicine | Admitting: Emergency Medicine

## 2015-09-19 ENCOUNTER — Emergency Department: Payer: Medicaid Other

## 2015-09-19 ENCOUNTER — Encounter: Payer: Self-pay | Admitting: Emergency Medicine

## 2015-09-19 DIAGNOSIS — F17210 Nicotine dependence, cigarettes, uncomplicated: Secondary | ICD-10-CM | POA: Diagnosis not present

## 2015-09-19 DIAGNOSIS — Z79899 Other long term (current) drug therapy: Secondary | ICD-10-CM | POA: Insufficient documentation

## 2015-09-19 DIAGNOSIS — M545 Low back pain, unspecified: Secondary | ICD-10-CM

## 2015-09-19 MED ORDER — KETOROLAC TROMETHAMINE 30 MG/ML IJ SOLN
30.0000 mg | Freq: Once | INTRAMUSCULAR | Status: AC
  Administered 2015-09-19: 30 mg via INTRAVENOUS
  Filled 2015-09-19: qty 1

## 2015-09-19 MED ORDER — DIAZEPAM 5 MG PO TABS
5.0000 mg | ORAL_TABLET | Freq: Once | ORAL | Status: AC
  Administered 2015-09-19: 5 mg via ORAL
  Filled 2015-09-19: qty 1

## 2015-09-19 MED ORDER — METHOCARBAMOL 500 MG PO TABS: ORAL_TABLET | ORAL | Status: AC

## 2015-09-19 MED ORDER — HYDROCODONE-ACETAMINOPHEN 5-325 MG PO TABS
2.0000 | ORAL_TABLET | Freq: Once | ORAL | Status: AC
  Administered 2015-09-19: 2 via ORAL
  Filled 2015-09-19: qty 2

## 2015-09-19 MED ORDER — HYDROCODONE-ACETAMINOPHEN 5-325 MG PO TABS: 1.0000 | ORAL_TABLET | ORAL | Status: DC | PRN

## 2015-09-19 NOTE — Discharge Instructions (Signed)
Follow-up with your primary care doctor if any continued problems with your back.  Ice or heat to your back frequently. Did not take medication while driving or operating machinery. Robaxin is for muscle spasms 1 or 2 every 6 hours as needed. Norco as needed for severe pain. Continue your regular medication.

## 2015-09-19 NOTE — ED Notes (Signed)
States she developed pain to central lower back on Friday  States pain is not radiating and denies any fall  Ambulates well to treatment area

## 2015-09-19 NOTE — ED Notes (Signed)
Pt presents to ED with low central back pain. Pt denies injury. Pt denies dysuria. Pt denies nausea, vomiting or diarrhea. Pt in no apparent distress in triage.

## 2015-09-19 NOTE — ED Provider Notes (Signed)
[facility] Emergency Department Provider Note   ____________________________________________  Time seen: Approximately 12:33 PM  I have reviewed the triage vital signs and the nursing notes.   HISTORY  Chief Complaint Back Pain   HPI Hayley Smith is a 35 y.o. female is here complaint of low back pain for 5 days. Patient denies any radiation or paresthesias. Patient has not had a injury to her back. She denies any dysuria, nausea, vomiting or diarrhea. She denies any previous problems with her back in the past. She states she was lifting some things in the house with her husband and after that it became difficult for her to move or sit up or stand. Patient seen some over-the-counter medication without any relief of her pain. Currently she rates her pain as a 10 over 10 at this time.   History reviewed. No pertinent past medical history.  There are no active problems to display for this patient.   Past Surgical History  Procedure Laterality Date  . Abdominal hysterectomy      Current Outpatient Rx  Name  Route  Sig  Dispense  Refill  . atorvastatin (LIPITOR) 80 MG tablet   Oral   Take 80 mg by mouth daily.         Marland Kitchen. buPROPion (WELLBUTRIN XL) 300 MG 24 hr tablet   Oral   Take 300 mg by mouth daily.         . clonazePAM (KLONOPIN) 1 MG tablet   Oral   Take 1 mg by mouth 2 (two) times daily.         . divalproex (DEPAKOTE) 500 MG DR tablet   Oral   Take 500 mg by mouth 2 (two) times daily.         . meloxicam (MOBIC) 15 MG tablet   Oral   Take 15 mg by mouth daily.         . methylphenidate 54 MG PO CR tablet   Oral   Take 54 mg by mouth every morning.         Marland Kitchen. QUEtiapine (SEROQUEL) 300 MG tablet   Oral   Take 300 mg by mouth at bedtime.         Marland Kitchen. HYDROcodone-acetaminophen (NORCO/VICODIN) 5-325 MG tablet   Oral   Take 1 tablet by mouth every 4 (four) hours as needed for moderate pain.   20 tablet   0   .  methocarbamol (ROBAXIN) 500 MG tablet      Take one or 2 tablets 4 times a day as needed for muscle spasms.   30 tablet   0     Allergies Penicillins  No family history on file.  Social History Social History  Substance Use Topics  . Smoking status: Current Every Day Smoker -- 0.50 packs/day    Types: Cigarettes  . Smokeless tobacco: None  . Alcohol Use: No    Review of Systems Constitutional: No fever/chills Cardiovascular: Denies chest pain. Respiratory: Denies shortness of breath. Gastrointestinal: No abdominal pain.  No nausea, no vomiting.  No diarrhea.  No constipation. Genitourinary: Negative for dysuria. Musculoskeletal: Positive for low back pain. Skin: Negative for rash. Neurological: Negative for headaches, focal weakness or numbness.  10-point ROS otherwise negative.  ____________________________________________   PHYSICAL EXAM:  VITAL SIGNS: ED Triage Vitals  Enc Vitals Group     BP 09/19/15 1100 113/72 mmHg     Pulse Rate 09/19/15 1100 112     Resp 09/19/15 1100  18     Temp 09/19/15 1100 97.5 F (36.4 C)     Temp Source 09/19/15 1100 Oral     SpO2 09/19/15 1100 97 %     Weight 09/19/15 1100 230 lb (104.327 kg)     Height 09/19/15 1100 5\' 4"  (1.626 m)     Head Cir --      Peak Flow --      Pain Score 09/19/15 1112 10     Pain Loc --      Pain Edu? --      Excl. in GC? --     Constitutional: Alert and oriented. Well appearing and in no acute distress. Eyes: Conjunctivae are normal. PERRL. EOMI. Head: Atraumatic. Nose: No congestion/rhinnorhea. Neck: No stridor.   Cardiovascular: Normal rate, regular rhythm. Grossly normal heart sounds.  Good peripheral circulation. Respiratory: Normal respiratory effort.  No retractions. Lungs occasional expiratory wheeze heard throughout. Gastrointestinal: Soft and nontender. No distention. Marland Kitchen No CVA tenderness. Musculoskeletal: On examination back there is no gross deformity. There is moderate tenderness  on palpation of the sacrum and coccyx area. There is some minimal tenderness on palpation of L5-S1 area as well. Range of motion is restricted secondary to pain. There is no active muscle spasm seen at this time. Straight leg raises are limited secondary to pain Neurologic:  Normal speech and language. No gross focal neurologic deficits are appreciated. Reflexes were 2+ bilaterally. Skin:  Skin is warm, dry and intact. No rash noted. Psychiatric: Mood and affect are normal. Speech and behavior are normal.  ____________________________________________   LABS (all labs ordered are listed, but only abnormal results are displayed)  Labs Reviewed - No data to display  RADIOLOGY  Lumbar spine x-ray per radiologist is negative for acute findings. There is mild anterior vertebral spurring present. I, Tommi Rumps, personally viewed and evaluated these images (plain radiographs) as part of my medical decision making, as well as reviewing the written report by the radiologist. ____________________________________________   PROCEDURES  Procedure(s) performed: None  Critical Care performed: No  ____________________________________________   INITIAL IMPRESSION / ASSESSMENT AND PLAN / ED COURSE  Pertinent labs & imaging results that were available during my care of the patient were reviewed by me and considered in my medical decision making (see chart for details).  Prior to x-ray patient was given Norco 2 tablets and diazepam. After x-rays resulted patient states that she has not gotten any relief from the medication when she is moving. She states as long she is sitting still she is completely fine. Patient was given Toradol 30 mg IM in the emergency room. She is continue her regular medication and follow up with her primary care doctor. She was discharged on a prescription for Robaxin 1 or 2 4 times a day as needed for muscle spasms and Norco one every 4 hours as needed for pain. She  currently is taking meloxicam for arthritis. She is also encouraged to use moist heat to her back or ice for pain control. ____________________________________________   FINAL CLINICAL IMPRESSION(S) / ED DIAGNOSES  Final diagnoses:  Acute bilateral low back pain without sciatica      NEW MEDICATIONS STARTED DURING THIS VISIT:  New Prescriptions   HYDROCODONE-ACETAMINOPHEN (NORCO/VICODIN) 5-325 MG TABLET    Take 1 tablet by mouth every 4 (four) hours as needed for moderate pain.   METHOCARBAMOL (ROBAXIN) 500 MG TABLET    Take one or 2 tablets 4 times a day as needed for muscle spasms.  Note:  This document was prepared using Dragon voice recognition software and may include unintentional dictation errors.    Tommi Rumps, PA-C 09/19/15 1522  Phineas Semen, MD 09/20/15 [phone]

## 2015-10-24 ENCOUNTER — Encounter: Payer: Self-pay | Admitting: Emergency Medicine

## 2015-10-24 ENCOUNTER — Emergency Department: Payer: Medicaid Other

## 2015-10-24 ENCOUNTER — Emergency Department
Admission: EM | Admit: 2015-10-24 | Discharge: 2015-10-24 | Disposition: A | Discharge status: Home / Self Care | Payer: Medicaid Other | Attending: Emergency Medicine | Admitting: Emergency Medicine

## 2015-10-24 DIAGNOSIS — W2209XA Striking against other stationary object, initial encounter: Secondary | ICD-10-CM | POA: Insufficient documentation

## 2015-10-24 DIAGNOSIS — S90121A Contusion of right lesser toe(s) without damage to nail, initial encounter: Secondary | ICD-10-CM

## 2015-10-24 DIAGNOSIS — Y92009 Unspecified place in unspecified non-institutional (private) residence as the place of occurrence of the external cause: Secondary | ICD-10-CM | POA: Diagnosis not present

## 2015-10-24 DIAGNOSIS — Z79899 Other long term (current) drug therapy: Secondary | ICD-10-CM | POA: Insufficient documentation

## 2015-10-24 DIAGNOSIS — F17210 Nicotine dependence, cigarettes, uncomplicated: Secondary | ICD-10-CM | POA: Insufficient documentation

## 2015-10-24 DIAGNOSIS — Y939 Activity, unspecified: Secondary | ICD-10-CM | POA: Insufficient documentation

## 2015-10-24 DIAGNOSIS — Y999 Unspecified external cause status: Secondary | ICD-10-CM | POA: Diagnosis not present

## 2015-10-24 DIAGNOSIS — M545 Low back pain: Secondary | ICD-10-CM

## 2015-10-24 DIAGNOSIS — M549 Dorsalgia, unspecified: Secondary | ICD-10-CM | POA: Diagnosis present

## 2015-10-24 DIAGNOSIS — G8929 Other chronic pain: Secondary | ICD-10-CM

## 2015-10-24 MED ORDER — KETOROLAC TROMETHAMINE 30 MG/ML IJ SOLN
30.0000 mg | Freq: Once | INTRAMUSCULAR | Status: AC
  Administered 2015-10-24: 30 mg via INTRAMUSCULAR
  Filled 2015-10-24: qty 1

## 2015-10-24 MED ORDER — CYCLOBENZAPRINE HCL 5 MG PO TABS: 5.0000 mg | ORAL_TABLET | Freq: Three times a day (TID) | ORAL | Status: DC | PRN

## 2015-10-24 NOTE — ED Notes (Signed)
Patient to ER for c/o pain to right 4th toe. Also c/o lower back pain. States she hit foot on deep freezer at home, twisted back in process.

## 2015-10-24 NOTE — ED Notes (Signed)
Pt alert and oriented X4, active, cooperative, pt in NAD. RR even and unlabored, color WNL.  Pt informed to return if any life threatening symptoms occur.   

## 2015-10-24 NOTE — ED Provider Notes (Signed)
[facility] Emergency Department Provider Note  ____________________________________________  Time seen: Approximately 4:30 PM  I have reviewed the triage vital signs and the nursing notes.   HISTORY  Chief Complaint Toe Pain and Back Pain    HPI Hayley Smith is a 35 y.o. female, NAD, presents to the emergency department with 3 day history of right 4th toe pain and lower back pain. Patient states that she stubbed her toe on her freezer and subsequently reached down for the toe and twisted her back. She was diagnosed with lumbar osteoarthritis in October 2016 and feels like she "re-tweeked" her back Monday. She took her last Robaxin and Meloxicam Monday without relief. States the meloxicam helps with knee arthritis but not her back. Notes the Robaxin did not help either. She has been taking Goody powder and Tylenol Arthritis over the counter without relief of pain. Patient states that Monday and Tuesday she was unable to move her injured toe. Today was the first day she was able to move her toe but still with moderate pain. Patient denies numbness, weakness, or tingling into lower extremities.Has had no saddle paresthesias or loss of bowel or bladder control. Back pain is currently 10/10. Patient notes she has a follow-up with her primary care provider on Tuesday in regards to her chronic back pain.   History reviewed. No pertinent past medical history.  There are no active problems to display for this patient.   Past Surgical History  Procedure Laterality Date  . Abdominal hysterectomy    . Cyst removal hand      Current Outpatient Rx  Name  Route  Sig  Dispense  Refill  . atorvastatin (LIPITOR) 80 MG tablet   Oral   Take 80 mg by mouth daily.         Marland Kitchen. buPROPion (WELLBUTRIN XL) 300 MG 24 hr tablet   Oral   Take 300 mg by mouth daily.         . clonazePAM (KLONOPIN) 1 MG tablet   Oral   Take 1 mg by mouth 2 (two) times daily.         .  cyclobenzaprine (FLEXERIL) 5 MG tablet   Oral   Take 1 tablet (5 mg total) by mouth 3 (three) times daily as needed for muscle spasms.   21 tablet   0   . divalproex (DEPAKOTE) 500 MG DR tablet   Oral   Take 500 mg by mouth 2 (two) times daily.         Marland Kitchen. HYDROcodone-acetaminophen (NORCO/VICODIN) 5-325 MG tablet   Oral   Take 1 tablet by mouth every 4 (four) hours as needed for moderate pain.   20 tablet   0   . meloxicam (MOBIC) 15 MG tablet   Oral   Take 15 mg by mouth daily.         . methocarbamol (ROBAXIN) 500 MG tablet      Take one or 2 tablets 4 times a day as needed for muscle spasms.   30 tablet   0   . methylphenidate 54 MG PO CR tablet   Oral   Take 54 mg by mouth every morning.         Marland Kitchen. QUEtiapine (SEROQUEL) 300 MG tablet   Oral   Take 300 mg by mouth at bedtime.           Allergies Gabapentin and Penicillins  No family history on file.  Social History Social History  Substance  Use Topics  . Smoking status: Current Every Day Smoker -- 0.50 packs/day    Types: Cigarettes  . Smokeless tobacco: None  . Alcohol Use: No     Review of Systems  Constitutional: No fever/chills Cardiovascular: No chest pain. Respiratory: No shortness of breath. No wheezing.  Gastrointestinal: No abdominal pain.  No nausea, vomiting. Musculoskeletal: Chronic lower back pain, acute pain over 4th right toe.  Skin: Positive bruising right fourth toe. Negative for rash him a redness, open wounds or lacerations. Neurological: Negative for headaches, focal weakness or numbness. No saddle paresthesias or loss of bowel or bladder control. 10-point ROS otherwise negative.  ____________________________________________   PHYSICAL EXAM:  VITAL SIGNS: ED Triage Vitals  Enc Vitals Group     BP 10/24/15 1611 124/96 mmHg     Pulse Rate 10/24/15 1611 106     Resp 10/24/15 1611 20     Temp 10/24/15 1611 97.8 F (36.6 C)     Temp Source 10/24/15 1611 Oral     SpO2  10/24/15 1611 97 %     Weight 10/24/15 1611 230 lb (104.327 kg)     Height 10/24/15 1611  (1.626 m)     Head Cir --      Peak Flow --      Pain Score 10/24/15 1611 10     Pain Loc --      Pain Edu? --      Excl. in GC? --     Constitutional: Alert and oriented. Well appearing and in no acute distress. Eyes: Conjunctivae are normal.   Head: Atraumatic and normocephalic. Cardiovascular:   Good peripheral circulation with 2+ pulses noted in the right lower extremity. Capillary refill is brisk in all digits of the right foot. Respiratory: Normal respiratory effort without tachypnea or retractions.  Musculoskeletal: Diffuse tenderness to palpation about the right fourth toe. Patient unable to move the right fourth toe without significant pain. No crepitus or bony abnormality noted to palpation of the right fourth toe. Patient notes diffuse tenderness to palpation about the lumbar spine. No step offs or spinal deformity is noted.  Neurologic:  Normal speech and language. No gross focal neurologic deficits are appreciated.  Skin:  Blue ecchymosis noted about the distal portion of the right fourth toe. Skin is warm, dry and intact. No rash, open wounds or lacerations noted. Psychiatric: Mood and affect are normal. Speech and behavior are normal. Patient exhibits appropriate insight and judgement.   ____________________________________________   LABS  None ____________________________________________  EKG  None ____________________________________________  RADIOLOGY I have personally viewed and evaluated these images (plain radiographs) as part of my medical decision making, as well as reviewing the written report by the radiologist.  Dg Toe 4th Right  10/24/2015  CLINICAL DATA:  Right fourth toe pain after hitting her foot on a deep freezer at home. EXAM: RIGHT FOURTH TOE COMPARISON:  None. FINDINGS: There is no evidence of fracture or dislocation. There is no evidence of  arthropathy or other focal bone abnormality. Soft tissues are unremarkable. IMPRESSION: Normal examination. Electronically Signed   By: Beckie Salts M.D.   On: 10/24/2015 16:58    ____________________________________________    PROCEDURES  Procedure(s) performed: None    Medications  ketorolac (TORADOL) 30 MG/ML injection 30 mg (30 mg Intramuscular Given 10/24/15 1637)     ____________________________________________   INITIAL IMPRESSION / ASSESSMENT AND PLAN / ED COURSE  Patient's diagnosis is consistent with contusion of right fourth toe and chronic lower  back pain. Patient will be discharged home with prescriptions for cyclobenzaprine to take as needed for lower back pain. Patient may continue meloxicam at home as prescribed by her primary care provider. Patient is to keep her appointment on Tuesday with her primary care provider for follow-up of her chronic back pain and arthritis.  Patient is given ED precautions to return to the ED for any worsening or new symptoms.      ____________________________________________  FINAL CLINICAL IMPRESSION(S) / ED DIAGNOSES  Final diagnoses:  Contusion of fourth toe, right, initial encounter  Chronic lower back pain      NEW MEDICATIONS STARTED DURING THIS VISIT:  Discharge Medication List as of 10/24/2015  5:14 PM    START taking these medications   Details  cyclobenzaprine (FLEXERIL) 5 MG tablet Take 1 tablet (5 mg total) by mouth 3 (three) times daily as needed for muscle spasms., Starting 10/24/2015, Until Discontinued, Print             Hope Pigeon, PA-C 10/24/15 1833  Phineas Semen, MD 10/24/15 2105

## 2015-10-24 NOTE — Discharge Instructions (Signed)
Back Exercises °If you have pain in your back, do these exercises 2-3 times each day or as told by your doctor. When the pain goes away, do the exercises once each day, but repeat the steps more times for each exercise (do more repetitions). If you do not have pain in your back, do these exercises once each day or as told by your doctor. °EXERCISES °Single Knee to Chest °Do these steps 3-5 times in a row for each leg: °· Lie on your back on a firm bed or the floor with your legs stretched out. °· Bring one knee to your chest. °· Hold your knee to your chest by grabbing your knee or thigh. °· Pull on your knee until you feel a gentle stretch in your lower back. °· Keep doing the stretch for 10-30 seconds. °· Slowly let go of your leg and straighten it. °Pelvic Tilt °Do these steps 5-10 times in a row: °· Lie on your back on a firm bed or the floor with your legs stretched out. °· Bend your knees so they point up to the ceiling. Your feet should be flat on the floor. °· Tighten your lower belly (abdomen) muscles to press your lower back against the floor. This will make your tailbone point up to the ceiling instead of pointing down to your feet or the floor. °· Stay in this position for 5-10 seconds while you gently tighten your muscles and breathe evenly. °Cat-Cow °Do these steps until your lower back bends more easily: °· Get on your hands and knees on a firm surface. Keep your hands under your shoulders, and keep your knees under your hips. You may put padding under your knees. °· Let your head hang down, and make your tailbone point down to the floor so your lower back is round like the back of a cat. °· Stay in this position for 5 seconds. °· Slowly lift your head and make your tailbone point up to the ceiling so your back hangs low (sags) like the back of a cow. °· Stay in this position for 5 seconds. °Press-Ups °Do these steps 5-10 times in a row: °1. Lie on your belly (face-down) on the floor. °2. Place your  hands near your head, about shoulder-width apart. °3. While you keep your back relaxed and keep your hips on the floor, slowly straighten your arms to raise the top half of your body and lift your shoulders. Do not use your back muscles. To make yourself more comfortable, you may change where you place your hands. °4. Stay in this position for 5 seconds. °5. Slowly return to lying flat on the floor. °Bridges °Do these steps 10 times in a row: °1. Lie on your back on a firm surface. °2. Bend your knees so they point up to the ceiling. Your feet should be flat on the floor. °3. Tighten your butt muscles and lift your butt off of the floor until your waist is almost as high as your knees. If you do not feel the muscles working in your butt and the back of your thighs, slide your feet 1-2 inches farther away from your butt. °4. Stay in this position for 3-5 seconds. °5. Slowly lower your butt to the floor, and let your butt muscles relax. °If this exercise is too easy, try doing it with your arms crossed over your chest. °Belly Crunches °Do these steps 5-10 times in a row: °1. Lie on your back on a firm bed   or the floor with your legs stretched out. 2. Bend your knees so they point up to the ceiling. Your feet should be flat on the floor. 3. Cross your arms over your chest. 4. Tip your chin a little bit toward your chest but do not bend your neck. 5. Tighten your belly muscles and slowly raise your chest just enough to lift your shoulder blades a tiny bit off of the floor. 6. Slowly lower your chest and your head to the floor. Back Lifts Do these steps 5-10 times in a row: 1. Lie on your belly (face-down) with your arms at your sides, and rest your forehead on the floor. 2. Tighten the muscles in your legs and your butt. 3. Slowly lift your chest off of the floor while you keep your hips on the floor. Keep the back of your head in line with the curve in your back. Look at the floor while you do this. 4. Stay  in this position for 3-5 seconds. 5. Slowly lower your chest and your face to the floor. GET HELP IF:  Your back pain gets a lot worse when you do an exercise.  Your back pain does not lessen 2 hours after you exercise. If you have any of these problems, stop doing the exercises. Do not do them again unless your doctor says it is okay. GET HELP RIGHT AWAY IF:  You have sudden, very bad back pain. If this happens, stop doing the exercises. Do not do them again unless your doctor says it is okay.   This information is not intended to replace advice given to you by your health care provider. Make sure you discuss any questions you have with your health care provider.   Document Released: 04/26/2010 Document Revised: 12/13/2014 Document Reviewed: 05/18/2014 Elsevier Interactive Patient Education 2016 Else[employer] Inc.  Chronic Back Pain  When back pain lasts longer than 3 months, it is called chronic back pain.People with chronic back pain often go through certain periods that are more intense (flare-ups).  CAUSES Chronic back pain can be caused by wear and tear (degeneration) on different structures in your back. These structures include:  The bones of your spine (vertebrae) and the joints surrounding your spinal cord and nerve roots (facets).  The strong, fibrous tissues that connect your vertebrae (ligaments). Degeneration of these structures may result in pressure on your nerves. This can lead to constant pain. HOME CARE INSTRUCTIONS  Avoid bending, heavy lifting, prolonged sitting, and activities which make the problem worse.  Take brief periods of rest throughout the day to reduce your pain. Lying down or standing usually is better than sitting while you are resting.  Take over-the-counter or prescription medicines only as directed by your caregiver. SEEK IMMEDIATE MEDICAL CARE IF:   You have weakness or numbness in one of your legs or feet.  You have trouble controlling your  bladder or bowels.  You have nausea, vomiting, abdominal pain, shortness of breath, or fainting.   This information is not intended to replace advice given to you by your health care provider. Make sure you discuss any questions you have with your health care provider.   Document Released: 05/01/2004 Document Revised: 06/16/2011 Document Reviewed: 09/11/2014 Elsevier Interactive Patient Education 2016 Else[employer] Inc.  Contusion A contusion is a deep bruise. Contusions happen when an injury causes bleeding under the skin. Symptoms of bruising include pain, swelling, and discolored skin. The skin may turn blue, purple, or yellow. HOME CARE   Rest  the injured area.  If told, put ice on the injured area.  Put ice in a plastic bag.  Place a towel between your skin and the bag.  Leave the ice on for 20 minutes, 2-3 times per day.  If told, put light pressure (compression) on the injured area using an elastic bandage. Make sure the bandage is not too tight. Remove it and put it back on as told by your doctor.  If possible, raise (elevate) the injured area above the level of your heart while you are sitting or lying down.  Take over-the-counter and prescription medicines only as told by your doctor. GET HELP IF:  Your symptoms do not get better after several days of treatment.  Your symptoms get worse.  You have trouble moving the injured area. GET HELP RIGHT AWAY IF:   You have very bad pain.  You have a loss of feeling (numbness) in a hand or foot.  Your hand or foot turns pale or cold.   This information is not intended to replace advice given to you by your health care provider. Make sure you discuss any questions you have with your health care provider.   Document Released: 09/10/2007 Document Revised: 12/13/2014 Document Reviewed: 08/09/2014 Elsevier Interactive Patient Education 2016 Elsevier Inc.  Cryotherapy Cryotherapy is when you put ice on your injury. Ice helps  lessen pain and puffiness (swelling) after an injury. Ice works the best when you start using it in the first 24 to 48 hours after an injury. HOME CARE  Put a dry or damp towel between the ice pack and your skin.  You may press gently on the ice pack.  Leave the ice on for no more than 10 to 20 minutes at a time.  Check your skin after 5 minutes to make sure your skin is okay.  Rest at least 20 minutes between ice pack uses.  Stop using ice when your skin loses feeling (numbness).  Do not use ice on someone who cannot tell you when it hurts. This includes small children and people with memory problems (dementia). GET HELP RIGHT AWAY IF:  You have white spots on your skin.  Your skin turns blue or pale.  Your skin feels waxy or hard.  Your puffiness gets worse. MAKE SURE YOU:   Understand these instructions.  Will watch your condition.  Will get help right away if you are not doing well or get worse.   This information is not intended to replace advice given to you by your health care provider. Make sure you discuss any questions you have with your health care provider.   Document Released: 09/10/2007 Document Revised: 06/16/2011 Document Reviewed: 11/14/2010 Elsevier Interactive Patient Education [employer] Else[employer] Inc[employer]

## 2016-07-02 ENCOUNTER — Encounter: Payer: Self-pay | Admitting: Emergency Medicine

## 2016-07-02 ENCOUNTER — Emergency Department
Admission: EM | Admit: 2016-07-02 | Discharge: 2016-07-02 | Disposition: A | Discharge status: Home / Self Care | Payer: Medicaid Other | Attending: Emergency Medicine | Admitting: Emergency Medicine

## 2016-07-02 DIAGNOSIS — Y929 Unspecified place or not applicable: Secondary | ICD-10-CM | POA: Insufficient documentation

## 2016-07-02 DIAGNOSIS — X501XXA Overexertion from prolonged static or awkward postures, initial encounter: Secondary | ICD-10-CM | POA: Insufficient documentation

## 2016-07-02 DIAGNOSIS — Y939 Activity, unspecified: Secondary | ICD-10-CM | POA: Insufficient documentation

## 2016-07-02 DIAGNOSIS — Y999 Unspecified external cause status: Secondary | ICD-10-CM | POA: Diagnosis not present

## 2016-07-02 DIAGNOSIS — S299XXA Unspecified injury of thorax, initial encounter: Secondary | ICD-10-CM | POA: Diagnosis present

## 2016-07-02 DIAGNOSIS — Z79899 Other long term (current) drug therapy: Secondary | ICD-10-CM | POA: Insufficient documentation

## 2016-07-02 DIAGNOSIS — F17210 Nicotine dependence, cigarettes, uncomplicated: Secondary | ICD-10-CM | POA: Insufficient documentation

## 2016-07-02 DIAGNOSIS — S239XXA Sprain of unspecified parts of thorax, initial encounter: Secondary | ICD-10-CM | POA: Diagnosis not present

## 2016-07-02 DIAGNOSIS — M62830 Muscle spasm of back: Secondary | ICD-10-CM

## 2016-07-02 MED ORDER — KETOROLAC TROMETHAMINE 10 MG PO TABS: 10.0000 mg | ORAL_TABLET | Freq: Three times a day (TID) | ORAL | 0 refills | Status: DC

## 2016-07-02 MED ORDER — CYCLOBENZAPRINE HCL 5 MG PO TABS: 5.0000 mg | ORAL_TABLET | Freq: Three times a day (TID) | ORAL | 0 refills | Status: DC | PRN

## 2016-07-02 MED ORDER — KETOROLAC TROMETHAMINE 60 MG/2ML IM SOLN
30.0000 mg | Freq: Once | INTRAMUSCULAR | Status: AC
  Administered 2016-07-02: 30 mg via INTRAMUSCULAR
  Filled 2016-07-02: qty 2

## 2016-07-02 MED ORDER — HYDROCODONE-ACETAMINOPHEN 5-325 MG PO TABS
1.0000 | ORAL_TABLET | Freq: Once | ORAL | Status: AC
  Administered 2016-07-02: 1 via ORAL
  Filled 2016-07-02: qty 1

## 2016-07-02 MED ORDER — ORPHENADRINE CITRATE 30 MG/ML IJ SOLN
60.0000 mg | INTRAMUSCULAR | Status: AC
  Administered 2016-07-02: 60 mg via INTRAMUSCULAR
  Filled 2016-07-02 (×2): qty 2

## 2016-07-02 NOTE — ED Provider Notes (Signed)
[facility] Emergency Department Provider Note ____________________________________________  Time seen: 1640  I have reviewed the triage vital signs and the nursing notes.  HISTORY  Chief Complaint  Back Pain  HPI Hayley BrunnerWhitney D Ramdass is a 36 y.o. female presents to the ED for evaluation of acute thoracic muscle pain. She describes onset of sudden muscle pain and spasms while stretching yesterday. Since that time, she has had ongoing muscle tightness. She denies chest pain, shortness of breath, or distal paresthesias. She denies any relief with Advil, Goody's Powder, or heating pads.   History reviewed. No pertinent past medical history.  There are no active problems to display for this patient.   Past Surgical History:  Procedure Laterality Date  . ABDOMINAL HYSTERECTOMY    . CYST REMOVAL HAND      Prior to Admission medications   Medication Sig Start Date End Date Taking? Authorizing Provider  atorvastatin (LIPITOR) 80 MG tablet Take 80 mg by mouth daily.    Historical Provider, MD  buPROPion (WELLBUTRIN XL) 300 MG 24 hr tablet Take 300 mg by mouth daily.    Historical Provider, MD  clonazePAM (KLONOPIN) 1 MG tablet Take 1 mg by mouth 2 (two) times daily.    Historical Provider, MD  cyclobenzaprine (FLEXERIL) 5 MG tablet Take 1 tablet (5 mg total) by mouth 3 (three) times daily as needed for muscle spasms. 07/02/16   Bryndan Bilyk V Bacon Bayron Dalto, PA-C  divalproex (DEPAKOTE) 500 MG DR tablet Take 500 mg by mouth 2 (two) times daily.    Historical Provider, MD  HYDROcodone-acetaminophen (NORCO/VICODIN) 5-325 MG tablet Take 1 tablet by mouth every 4 (four) hours as needed for moderate pain. 09/19/15   Tommi Rumpshonda L Summers, PA-C  ketorolac (TORADOL) 10 MG tablet Take 1 tablet (10 mg total) by mouth every 8 (eight) hours. 07/02/16   Melani Brisbane V Bacon Kyonna Frier, PA-C  meloxicam (MOBIC) 15 MG tablet Take 15 mg by mouth daily.    Historical Provider, MD  methocarbamol (ROBAXIN) 500  MG tablet Take one or 2 tablets 4 times a day as needed for muscle spasms. 09/19/15   Tommi Rumpshonda L Summers, PA-C  methylphenidate 54 MG PO CR tablet Take 54 mg by mouth every morning.    Historical Provider, MD  QUEtiapine (SEROQUEL) 300 MG tablet Take 300 mg by mouth at bedtime.    Historical Provider, MD    Allergies Gabapentin and Penicillins  No family history on file.  Social History Social History  Substance Use Topics  . Smoking status: Current Every Day Smoker    Packs/day: 0.50    Types: Cigarettes  . Smokeless tobacco: Never Used  . Alcohol use No    Review of Systems  Constitutional: Negative for fever. Cardiovascular: Negative for chest pain. Respiratory: Negative for shortness of breath. Gastrointestinal: Negative for abdominal pain, vomiting and diarrhea. Genitourinary: Negative for dysuria. Musculoskeletal: Positive for mid-back pain. Skin: Negative for rash. Neurological: Negative for headaches, focal weakness or numbness. ____________________________________________  PHYSICAL EXAM:  VITAL SIGNS: ED Triage Vitals [07/02/16 1613]  Enc Vitals Group     BP 126/69     Pulse Rate (!) 110     Resp 18     Temp 98 F (36.7 C)     Temp Source Oral     SpO2 99 %     Weight 215 lb (97.5 kg)     Height 5\' 4"  (1.626 m)     Head Circumference      Peak Flow  Pain Score 10     Pain Loc      Pain Edu?      Excl. in GC?     Constitutional: Alert and oriented. Well appearing and in no distress. Head: Normocephalic and atraumatic. Cardiovascular: Normal rate, regular rhythm. Normal distal pulses. Respiratory: Normal respiratory effort. No wheezes/rales/rhonchi. Musculoskeletal: Normal spinal alignment without midline tenderness, spasm, deformity, or step-off. Patient is tender to mild touch to the base of the neck and middle trapezius, bilaterally. Nontender with normal range of motion in all extremities.  Neurologic:  CN II-XII grossly intact. Normal UE DTRs  bilaterally. Normal gait without ataxia. Normal speech and language. No gross focal neurologic deficits are appreciated. Skin:  Skin is warm, dry and intact. No rash noted. Psychiatric: Mood and affect are normal. Patient exhibits appropriate insight and judgment. ____________________________________________  PROCEDURES  Toradol 30 mg IM Norflex 60 mg IM Norco 5-325 mg PO ____________________________________________  INITIAL IMPRESSION / ASSESSMENT AND PLAN / ED COURSE  Patient with acute musculoskeletal pain to the midback. Her exam is normal at this time. She is discharged with prescriptions for Flexeril and Toradol. She is advised to apply ice and moist heat. She will follow-up with Dr. Carlynn Purl for ongoing symptoms.  ____________________________________________  FINAL CLINICAL IMPRESSION(S) / ED DIAGNOSES  Final diagnoses:  Thoracic back sprain, initial encounter      Lissa Hoard, PA-C 07/02/16 1711    Loleta Rose, MD 07/02/16 2040

## 2016-07-02 NOTE — Discharge Instructions (Signed)
Your exam is consistent with muscle strain and spasms. Take the prescription muscle relaxant and anti-inflammatory as directed. Alternate ice with moist heat compresses to promote healing. Consider a light back massage as needed. Follow-up with Dr. Carlynn PurlSowles for ongoing symptoms.

## 2016-07-02 NOTE — ED Triage Notes (Signed)
Back when stretching, happened yesterday.

## 2017-02-08 ENCOUNTER — Encounter: Payer: Self-pay | Admitting: Intensive Care

## 2017-02-08 ENCOUNTER — Emergency Department
Admission: EM | Admit: 2017-02-08 | Discharge: 2017-02-08 | Disposition: A | Discharge status: Home / Self Care | Payer: Medicaid Other | Attending: Student in an Organized Health Care Education/Training Program | Admitting: Student in an Organized Health Care Education/Training Program

## 2017-02-08 DIAGNOSIS — S76211A Strain of adductor muscle, fascia and tendon of right thigh, initial encounter: Secondary | ICD-10-CM | POA: Insufficient documentation

## 2017-02-08 DIAGNOSIS — Y999 Unspecified external cause status: Secondary | ICD-10-CM | POA: Diagnosis not present

## 2017-02-08 DIAGNOSIS — Y929 Unspecified place or not applicable: Secondary | ICD-10-CM | POA: Diagnosis not present

## 2017-02-08 DIAGNOSIS — F17210 Nicotine dependence, cigarettes, uncomplicated: Secondary | ICD-10-CM | POA: Insufficient documentation

## 2017-02-08 DIAGNOSIS — W010XXA Fall on same level from slipping, tripping and stumbling without subsequent striking against object, initial encounter: Secondary | ICD-10-CM | POA: Diagnosis not present

## 2017-02-08 DIAGNOSIS — Y939 Activity, unspecified: Secondary | ICD-10-CM | POA: Insufficient documentation

## 2017-02-08 DIAGNOSIS — Z79899 Other long term (current) drug therapy: Secondary | ICD-10-CM | POA: Insufficient documentation

## 2017-02-08 DIAGNOSIS — R1031 Right lower quadrant pain: Secondary | ICD-10-CM | POA: Diagnosis present

## 2017-02-08 HISTORY — DX: Bipolar disorder, unspecified: F31.9

## 2017-02-08 HISTORY — DX: Anxiety disorder, unspecified: F41.9

## 2017-02-08 HISTORY — DX: Attention-deficit hyperactivity disorder, unspecified type: F90.9

## 2017-02-08 MED ORDER — CYCLOBENZAPRINE HCL 5 MG PO TABS: ORAL_TABLET | ORAL | 0 refills | Status: AC

## 2017-02-08 MED ORDER — NAPROXEN 500 MG PO TABS: 500.0000 mg | ORAL_TABLET | Freq: Two times a day (BID) | ORAL | 0 refills | Status: AC

## 2017-02-08 MED ORDER — KETOROLAC TROMETHAMINE 30 MG/ML IJ SOLN
30.0000 mg | Freq: Once | INTRAMUSCULAR | Status: AC
  Administered 2017-02-08: 30 mg via INTRAMUSCULAR
  Filled 2017-02-08: qty 1

## 2017-02-08 MED ORDER — OXYCODONE-ACETAMINOPHEN 5-325 MG PO TABS
1.0000 | ORAL_TABLET | Freq: Once | ORAL | Status: AC
  Administered 2017-02-08: 1 via ORAL
  Filled 2017-02-08: qty 1

## 2017-02-08 MED ORDER — ORPHENADRINE CITRATE 30 MG/ML IJ SOLN
60.0000 mg | Freq: Two times a day (BID) | INTRAMUSCULAR | Status: DC
  Administered 2017-02-08: 60 mg via INTRAMUSCULAR
  Filled 2017-02-08: qty 2

## 2017-02-08 MED ORDER — LIDOCAINE 5 % EX PTCH
1.0000 | MEDICATED_PATCH | CUTANEOUS | Status: DC
  Administered 2017-02-08: 1 via TRANSDERMAL
  Filled 2017-02-08: qty 1

## 2017-02-08 NOTE — ED Triage Notes (Addendum)
Patient states "the other day my R upper leg started hurting" c/o pain from groin area down to thigh. Patient reports sliding on wet floor and thinks she may have stretched area. Ambulatory in triage. Denies hitting head

## 2017-02-08 NOTE — ED Provider Notes (Signed)
[facility] Emergency Department Provider Note  ____________________________________________  Time seen: Approximately 11:27 AM  I have reviewed the triage vital signs and the nursing notes.   HISTORY  Chief Complaint Groin Pain (right)    HPI Hayley Smith is a 36 y.o. female that presents to the emergency department for evaluation of right groin pain for 4 days. Patient states that she fell and did a splits movement on Tuesday. On Wednesday it was painful to move her right leg where muscle was stretched. She does not feel any masses or lumps. She has been taking ibuprofen and Tylenol without improvement. No additional injuries. No numbness, tingling.   Past Medical History:  Diagnosis Date  . ADHD   . Anxiety   . Bipolar 1 disorder (HCC)     There are no active problems to display for this patient.   Past Surgical History:  Procedure Laterality Date  . ABDOMINAL HYSTERECTOMY    . CYST REMOVAL HAND      Prior to Admission medications   Medication Sig Start Date End Date Taking? Authorizing Provider  atorvastatin (LIPITOR) 80 MG tablet Take 80 mg by mouth daily.    [provider]  buPROPion (WELLBUTRIN XL) 300 MG 24 hr tablet Take 300 mg by mouth daily.    [provider]  clonazePAM (KLONOPIN) 1 MG tablet Take 1 mg by mouth 2 (two) times daily.    [provider]  cyclobenzaprine (FLEXERIL) 5 MG tablet Take 1-2 tablets 3 times daily as needed 02/08/17   Enid Derry, PA-C  divalproex (DEPAKOTE) 500 MG DR tablet Take 500 mg by mouth 2 (two) times daily.    [provider]  HYDROcodone-acetaminophen (NORCO/VICODIN) 5-325 MG tablet Take 1 tablet by mouth every 4 (four) hours as needed for moderate pain. 09/19/15   Tommi Rumps, PA-C  ketorolac (TORADOL) 10 MG tablet Take 1 tablet (10 mg total) by mouth every 8 (eight) hours. 07/02/16   Menshew, Charlesetta Ivory, PA-C  meloxicam (MOBIC) 15 MG tablet Take 15  mg by mouth daily.    [provider]  methocarbamol (ROBAXIN) 500 MG tablet Take one or 2 tablets 4 times a day as needed for muscle spasms. 09/19/15   Tommi Rumps, PA-C  methylphenidate 54 MG PO CR tablet Take 54 mg by mouth every morning.    [provider]  naproxen (NAPROSYN) 500 MG tablet Take 1 tablet (500 mg total) 2 (two) times daily with a meal by mouth. 02/08/17 02/08/18  Enid Derry, PA-C  QUEtiapine (SEROQUEL) 300 MG tablet Take 300 mg by mouth at bedtime.    [provider]    Allergies Gabapentin and Penicillins  History reviewed. No pertinent family history.  Social History Social History   Tobacco Use  . Smoking status: Current Every Day Smoker    Packs/day: 0.50    Types: Cigarettes  . Smokeless tobacco: Never Used  Substance Use Topics  . Alcohol use: No  . Drug use: No     Review of Systems  Constitutional: No fever/chills Cardiovascular: No chest pain. Respiratory: No SOB. Gastrointestinal: No abdominal pain.  No nausea, no vomiting.  Musculoskeletal: Positive for groin pain. Skin: Negative for rash, abrasions, lacerations, ecchymosis. Neurological: Negative for headaches, numbness or tingling   ____________________________________________   PHYSICAL EXAM:  VITAL SIGNS: ED Triage Vitals  Enc Vitals Group     BP 02/08/17 1033 114/79     Pulse Rate 02/08/17 1033 (!) 105  Resp 02/08/17 1033 18     Temp 02/08/17 1033 98.1 F (36.7 C)     Temp Source 02/08/17 1033 Oral     SpO2 02/08/17 1033 96 %     Weight 02/08/17 1030 235 lb (106.6 kg)     Height 02/08/17 1030 5\' 5"  (1.651 m)     Head Circumference --      Peak Flow --      Pain Score 02/08/17 1030 10     Pain Loc --      Pain Edu? --      Excl. in GC? --      Constitutional: Alert and oriented. Well appearing and in no acute distress. Eyes: Conjunctivae are normal. PERRL. EOMI. Head: Atraumatic. ENT:      Ears:      Nose: No  congestion/rhinnorhea.      Mouth/Throat: Mucous membranes are moist.  Neck: No stridor.  Cardiovascular: Normal rate, regular rhythm.  Good peripheral circulation. Respiratory: Normal respiratory effort without tachypnea or retractions. Lungs CTAB. Good air entry to the bases with no decreased or absent breath sounds. Gastrointestinal: Bowel sounds 4 quadrants. Soft and nontender to palpation. No guarding or rigidity. No palpable masses. No distention.  Musculoskeletal: Full range of motion to all extremities. No gross deformities appreciated. Tenderness to palpation inside right thigh. Pain elicited with abduction of thigh. No masses felt. Neurologic:  Normal speech and language. No gross focal neurologic deficits are appreciated.  Skin:  Skin is warm, dry and intact. No rash noted.   ____________________________________________   LABS (all labs ordered are listed, but only abnormal results are displayed)  Labs Reviewed - No data to display ____________________________________________  EKG   ____________________________________________  RADIOLOGY  No results found.  ____________________________________________    PROCEDURES  Procedure(s) performed:    Procedures    Medications  orphenadrine (NORFLEX) injection 60 mg (60 mg Intramuscular Given 02/08/17 1132)  lidocaine (LIDODERM) 5 % 1 patch (1 patch Transdermal Patch Applied 02/08/17 1136)  ketorolac (TORADOL) 30 MG/ML injection 30 mg (30 mg Intramuscular Given 02/08/17 1134)  oxyCODONE-acetaminophen (PERCOCET/ROXICET) 5-325 MG per tablet 1 tablet (1 tablet Oral Given 02/08/17 1214)     ____________________________________________   INITIAL IMPRESSION / ASSESSMENT AND PLAN / ED COURSE  Pertinent labs & imaging results that were available during my care of the patient were reviewed by me and considered in my medical decision making (see chart for details).  Review of the [place] CSRS was performed in accordance of the  [facility] prior to dispensing any controlled drugs.   Patient's diagnosis is consistent with groin strain. Vital signs and exam are reassuring. Patient felt better after Toradol, Norflex, Lidoderm but the pain did not completely resolve. She was given a dose of oxycodone before leaving ED. Patient will be discharged home with prescriptions for flexeril and ibuprofen. Patient is to follow up with PCP as directed. Patient is given ED precautions to return to the ED for any worsening or new symptoms.     ____________________________________________  FINAL CLINICAL IMPRESSION(S) / ED DIAGNOSES  Final diagnoses:  Groin strain, right, initial encounter      NEW MEDICATIONS STARTED DURING THIS VISIT:  This SmartLink is deprecated. Use AVSMEDLIST instead to display the medication list for a patient.      This chart was dictated using voice recognition software/Dragon. Despite best efforts to proofread, errors can occur which can change the meaning. Any change was purely unintentional.    Enid DerryWagner, Alexsandro Salek, PA-C 02/08/17 1315  Willy Eddy, MD 02/08/17 [phone]

## 2017-02-22 ENCOUNTER — Emergency Department
Admission: EM | Admit: 2017-02-22 | Discharge: 2017-02-22 | Disposition: A | Discharge status: Home / Self Care | Payer: Medicaid Other | Attending: Emergency Medicine | Admitting: Emergency Medicine

## 2017-02-22 ENCOUNTER — Other Ambulatory Visit: Payer: Self-pay

## 2017-02-22 DIAGNOSIS — F17210 Nicotine dependence, cigarettes, uncomplicated: Secondary | ICD-10-CM | POA: Diagnosis not present

## 2017-02-22 DIAGNOSIS — F419 Anxiety disorder, unspecified: Secondary | ICD-10-CM | POA: Diagnosis not present

## 2017-02-22 DIAGNOSIS — L049 Acute lymphadenitis, unspecified: Secondary | ICD-10-CM | POA: Diagnosis not present

## 2017-02-22 DIAGNOSIS — F319 Bipolar disorder, unspecified: Secondary | ICD-10-CM | POA: Diagnosis not present

## 2017-02-22 DIAGNOSIS — N3090 Cystitis, unspecified without hematuria: Secondary | ICD-10-CM

## 2017-02-22 DIAGNOSIS — F909 Attention-deficit hyperactivity disorder, unspecified type: Secondary | ICD-10-CM | POA: Diagnosis not present

## 2017-02-22 DIAGNOSIS — M79604 Pain in right leg: Secondary | ICD-10-CM

## 2017-02-22 DIAGNOSIS — F603 Borderline personality disorder: Secondary | ICD-10-CM | POA: Diagnosis not present

## 2017-02-22 DIAGNOSIS — Z79899 Other long term (current) drug therapy: Secondary | ICD-10-CM | POA: Diagnosis not present

## 2017-02-22 DIAGNOSIS — R1031 Right lower quadrant pain: Secondary | ICD-10-CM | POA: Diagnosis present

## 2017-02-22 HISTORY — DX: Borderline personality disorder: F60.3

## 2017-02-22 LAB — URINALYSIS, COMPLETE (UACMP) WITH MICROSCOPIC
BILIRUBIN URINE: NEGATIVE
Glucose, UA: NEGATIVE mg/dL
HGB URINE DIPSTICK: NEGATIVE
KETONES UR: NEGATIVE mg/dL
LEUKOCYTES UA: NEGATIVE
NITRITE: NEGATIVE
PH: 7 (ref 5.0–8.0)
Protein, ur: 30 mg/dL — AB
RBC / HPF: NONE SEEN RBC/hpf (ref 0–5)
SPECIFIC GRAVITY, URINE: 1.021 (ref 1.005–1.030)

## 2017-02-22 MED ORDER — HYDROCODONE-ACETAMINOPHEN 5-325 MG PO TABS
1.0000 | ORAL_TABLET | Freq: Once | ORAL | Status: AC
Start: 2017-02-22 — End: 2017-02-22
  Administered 2017-02-22: 1 via ORAL
  Filled 2017-02-22: qty 1

## 2017-02-22 MED ORDER — SULFAMETHOXAZOLE-TRIMETHOPRIM 800-160 MG PO TABS: 1.0000 | ORAL_TABLET | Freq: Two times a day (BID) | ORAL | 0 refills | Status: DC

## 2017-02-22 MED ORDER — HYDROCODONE-ACETAMINOPHEN 5-325 MG PO TABS: 1.0000 | ORAL_TABLET | Freq: Four times a day (QID) | ORAL | 0 refills | Status: DC | PRN

## 2017-02-22 MED ORDER — PREDNISONE 10 MG (21) PO TBPK: ORAL_TABLET | ORAL | 0 refills | Status: DC

## 2017-02-22 NOTE — ED Provider Notes (Signed)
[facility] Emergency Department Provider Note   ____________________________________________   I have reviewed the triage vital signs and the nursing notes.   HISTORY  Chief Complaint Leg Pain    HPI Hayley Smith is a 36 y.o. female presents emergency department with right side groin pain and painful "nodule" for 3-4 days.  Patient recently seen on 02/08/2017 for a groin strain.  Patient reports symptoms partially improved with the treatment provided.  Patient noted over the last several days increased pain with standing and walking. She noted tenderness to palpation along the right inguinal region with a very tender nodule.  Patient denies fevers, chills, nausea or vomiting.  Patient does endorse mild dysuria and lower abdominal cramping over the last week.  She denies flank pain or back pain. Patient denies  headache, vision changes, chest pain, chest tightness, shortness of breath or abdominal pain.  Past Medical History:  Diagnosis Date  . ADHD   . Anxiety   . Bipolar 1 disorder (HCC)   . Borderline personality disorder (HCC)     There are no active problems to display for this patient.   Past Surgical History:  Procedure Laterality Date  . ABDOMINAL HYSTERECTOMY    . CYST REMOVAL HAND      Prior to Admission medications   Medication Sig Start Date End Date Taking? Authorizing Provider  atorvastatin (LIPITOR) 80 MG tablet Take 80 mg by mouth daily.    [provider]  buPROPion (WELLBUTRIN XL) 300 MG 24 hr tablet Take 300 mg by mouth daily.    [provider]  clonazePAM (KLONOPIN) 1 MG tablet Take 1 mg by mouth 2 (two) times daily.    [provider]  cyclobenzaprine (FLEXERIL) 5 MG tablet Take 1-2 tablets 3 times daily as needed 02/08/17   Enid DerryWagner, Ashley, PA-C  divalproex (DEPAKOTE) 500 MG DR tablet Take 500 mg by mouth 2 (two) times daily.    [provider]  HYDROcodone-acetaminophen (NORCO/VICODIN)  5-325 MG tablet Take 1 tablet every 6 (six) hours as needed by mouth for moderate pain. 02/22/17   Vega Withrow M, PA-C  ketorolac (TORADOL) 10 MG tablet Take 1 tablet (10 mg total) by mouth every 8 (eight) hours. 07/02/16   Menshew, Charlesetta IvoryJenise V Bacon, PA-C  meloxicam (MOBIC) 15 MG tablet Take 15 mg by mouth daily.    [provider]  methocarbamol (ROBAXIN) 500 MG tablet Take one or 2 tablets 4 times a day as needed for muscle spasms. 09/19/15   Tommi RumpsSummers, Rhonda L, PA-C  methylphenidate 54 MG PO CR tablet Take 54 mg by mouth every morning.    [provider]  naproxen (NAPROSYN) 500 MG tablet Take 1 tablet (500 mg total) 2 (two) times daily with a meal by mouth. 02/08/17 02/08/18  Enid DerryWagner, Ashley, PA-C  predniSONE (STERAPRED UNI-PAK 21 TAB) 10 MG (21) TBPK tablet Take 6 tablets on day 1. Take 5 tablets on day 2. Take 4 tablets on day 3. Take 3 tablets on day 4. Take 2 tablets on day 5. Take 1 tablets on day 6. 02/22/17   Martyn Timme M, PA-C  QUEtiapine (SEROQUEL) 300 MG tablet Take 300 mg by mouth at bedtime.    [provider]  sulfamethoxazole-trimethoprim (BACTRIM DS,SEPTRA DS) 800-160 MG tablet Take 1 tablet 2 (two) times daily by mouth. 02/22/17   Junie Engram M, PA-C    Allergies Gabapentin and Penicillins  No family history on file.  Social History Social History  Tobacco Use  . Smoking status: Current Every Day Smoker    Packs/day: 0.50    Types: Cigarettes  . Smokeless tobacco: Never Used  Substance Use Topics  . Alcohol use: No  . Drug use: No    Review of Systems Constitutional: Negative for fever/chills Eyes: No visual changes. Cardiovascular: Denies chest pain. Respiratory: Denies shortness of breath. Gastrointestinal: No abdominal pain.  No nausea, vomiting, diarrhea. Genitourinary: Positive for dysuria and increased frequency.  Musculoskeletal: Positive for right groin pain and swollen nodule Skin: Negative for rash. Neurological: Negative  for headaches.  ____________________________________________   PHYSICAL EXAM:  VITAL SIGNS: Patient Vitals for the past 24 hrs:  BP Temp Temp src Pulse Resp SpO2 Height Weight  02/22/17 1346 126/89 - - 85 18 96 % - -  02/22/17 1057 127/72 98.2 F (36.8 C) Oral (!) 102 17 97 % 5\' 4"  (1.626 m) 106.6 kg (235 lb)    Constitutional: Alert and oriented. Well appearing and in no acute distress.  Eyes: Conjunctivae are normal. PERRL. Head: Normocephalic and atraumatic. Cardiovascular: Normal rate, regular rhythm.  Good peripheral circulation. Respiratory: Normal respiratory effort without tachypnea or retractions.  Hematological/Lymphatic/Immunological: Right side Inguinal lymphadenopathy. Enlarged palpable tender inguinal lymph node ~marble size and mobile.  Gastrointestinal: Bowel sounds 4 quadrants. Soft and nontender to palpation. No guarding or rigidity. No palpable masses. No distention. No CVA tenderness. Genitourinary: Positive for dysuria and increased frequency.  No vaginal discharge. Lower pelvic pain. Musculoskeletal: Nontender with normal range of motion in all extremities. Neurologic: Normal speech and language.  Skin:  Skin is warm, dry and intact. No rash noted. Psychiatric: Mood and affect are normal. Speech and behavior are normal. Patient exhibits appropriate insight and judgement.  ____________________________________________   LABS (all labs ordered are listed, but only abnormal results are displayed)  Labs Reviewed  URINALYSIS, COMPLETE (UACMP) WITH MICROSCOPIC - Abnormal; Notable for the following components:      Result Value   Color, Urine AMBER (*)    APPearance CLOUDY (*)    Protein, ur 30 (*)    Bacteria, UA RARE (*)    Squamous Epithelial / LPF 6-30 (*)    All other components within normal limits    ____________________________________________  EKG none ____________________________________________  RADIOLOGY none ____________________________________________   PROCEDURES  Procedure(s) performed: no    Critical Care performed: no ____________________________________________   INITIAL IMPRESSION / ASSESSMENT AND PLAN / ED COURSE  Pertinent labs & imaging results that were available during my care of the patient were reviewed by me and considered in my medical decision making (see chart for details).  Patient presents to emergency department with worsening right side inguinal pain for 3-4 days.  History, physical exam findings and UA are consistent with lymphadenitis and clinically likely cystitis.  Patient will be prescribed bactrim for antibiotic coverage and prednisone taper. Patient advised to follow up with PCP if symptoms do not resolve or return to the emergency department if symptoms significantly worsened. Patient informed of clinical course, understand medical decision-making process, and agree with plan. ____________________________________________   FINAL CLINICAL IMPRESSION(S) / ED DIAGNOSES  Final diagnoses:  Cystitis  Lymphadenitis, acute  Pain of right lower extremity       NEW MEDICATIONS STARTED DURING THIS VISIT:  This SmartLink is deprecated. Use AVSMEDLIST instead to display the medication list for a patient.   Note:  This document was prepared using Dragon voice recognition software and may include unintentional dictation errors.    Kirstan Fentress M,  PA-C 02/22/17 1631    Schaevitz, Myra Rude, MD 02/23/17 [phone]

## 2017-02-22 NOTE — Discharge Instructions (Signed)
Take medication as prescribed.   Follow-up with your primary care provider as needed.  If symptoms do not fully resolve after completing recommended treatment follow-up with your primary care provider or return to the emergency department.  If symptoms significantly worsen do not hesitate to return the emergency department.

## 2017-02-22 NOTE — ED Triage Notes (Signed)
Pt states she was seen here 2 weeks ago after slipping and pulling a muscle in the right thigh, states sx have not improved.

## 2017-02-22 NOTE — ED Notes (Signed)
Slipped on floor about 3 weeks ago, did split, was here two weeks ago. Currently knot, deep in skin,  in upper medial thigh. No redness or swelling noted.  Patient states has not improved since last here.

## 2017-03-04 ENCOUNTER — Other Ambulatory Visit: Payer: Self-pay | Admitting: Family Medicine

## 2017-03-04 DIAGNOSIS — R1909 Other intra-abdominal and pelvic swelling, mass and lump: Secondary | ICD-10-CM

## 2017-03-09 ENCOUNTER — Ambulatory Visit
Admission: RE | Admit: 2017-03-09 | Discharge: 2017-03-09 | Disposition: A | Discharge status: Home / Self Care | Payer: Medicaid Other | Source: Ambulatory Visit | Attending: Family Medicine | Admitting: Family Medicine

## 2017-03-09 DIAGNOSIS — R1909 Other intra-abdominal and pelvic swelling, mass and lump: Secondary | ICD-10-CM

## 2017-03-18 ENCOUNTER — Other Ambulatory Visit: Payer: Self-pay | Admitting: Surgery

## 2017-03-18 DIAGNOSIS — R1909 Other intra-abdominal and pelvic swelling, mass and lump: Secondary | ICD-10-CM

## 2017-03-26 ENCOUNTER — Ambulatory Visit
Admission: RE | Admit: 2017-03-26 | Discharge: 2017-03-26 | Disposition: A | Discharge status: Home / Self Care | Payer: Medicaid Other | Source: Ambulatory Visit | Attending: Surgery | Admitting: Surgery

## 2017-03-26 DIAGNOSIS — R1909 Other intra-abdominal and pelvic swelling, mass and lump: Secondary | ICD-10-CM | POA: Insufficient documentation

## 2017-03-26 DIAGNOSIS — K760 Fatty (change of) liver, not elsewhere classified: Secondary | ICD-10-CM | POA: Diagnosis not present

## 2017-03-26 MED ORDER — IOPAMIDOL (ISOVUE-300) INJECTION 61%
100.0000 mL | Freq: Once | INTRAVENOUS | Status: AC | PRN
  Administered 2017-03-26: 100 mL via INTRAVENOUS

## 2018-03-08 IMAGING — CR DG LUMBAR SPINE 2-3V
3 series · 3 of 3 positions shown · non-contrast
Comparison: Radiographs 01/02/2009.

CLINICAL DATA: Increasing centralized low back pain, worsening
today. Lifting injury 5 days ago.

EXAM:
LUMBAR SPINE - 2-3 VIEW

[l-spine ap]
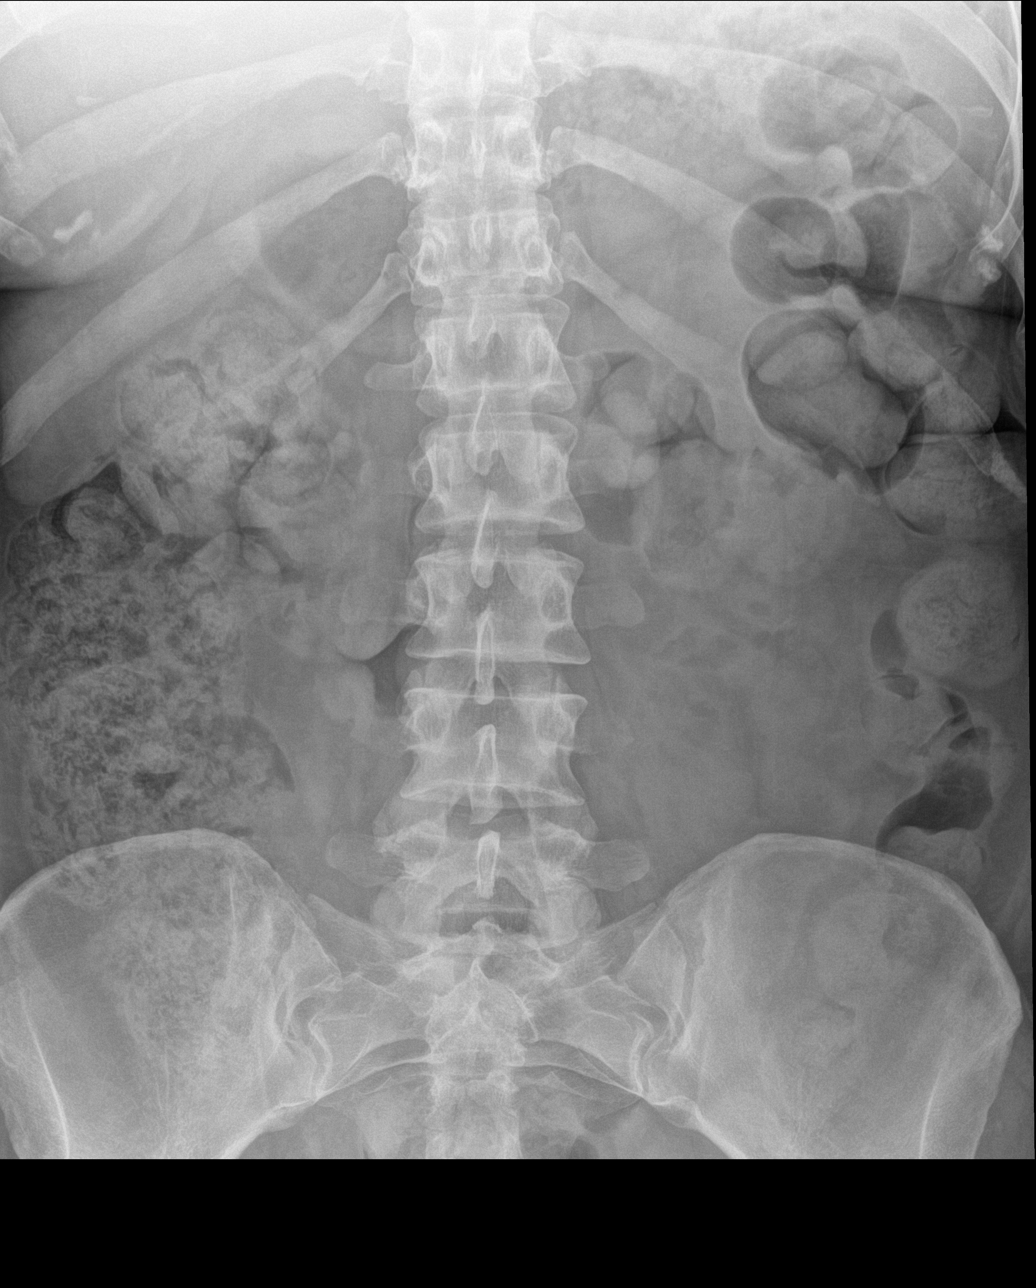

[l-spine lat]
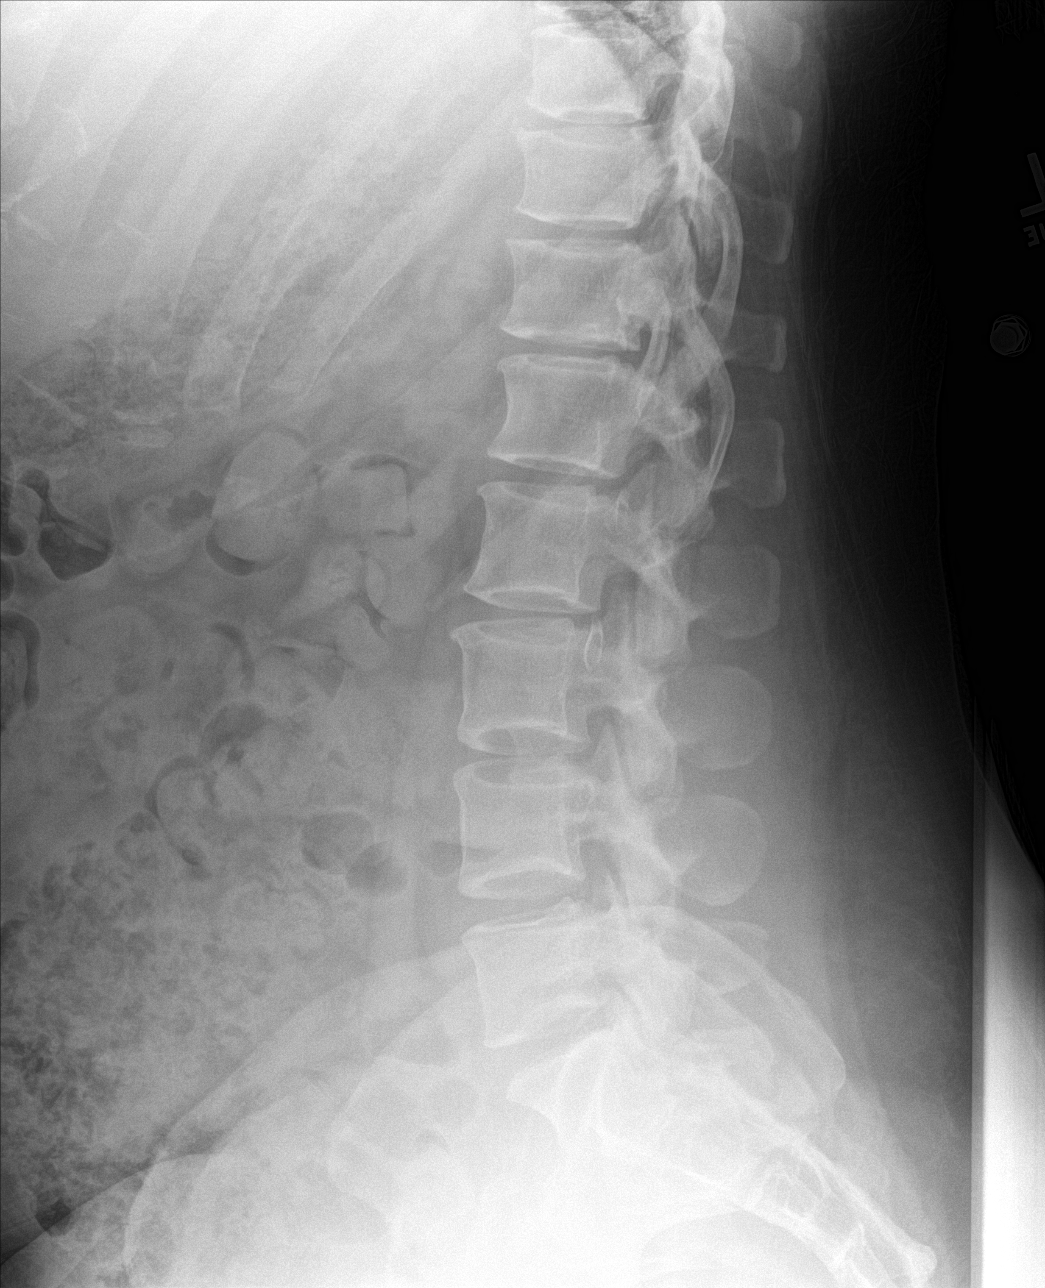

[l-spine spot]
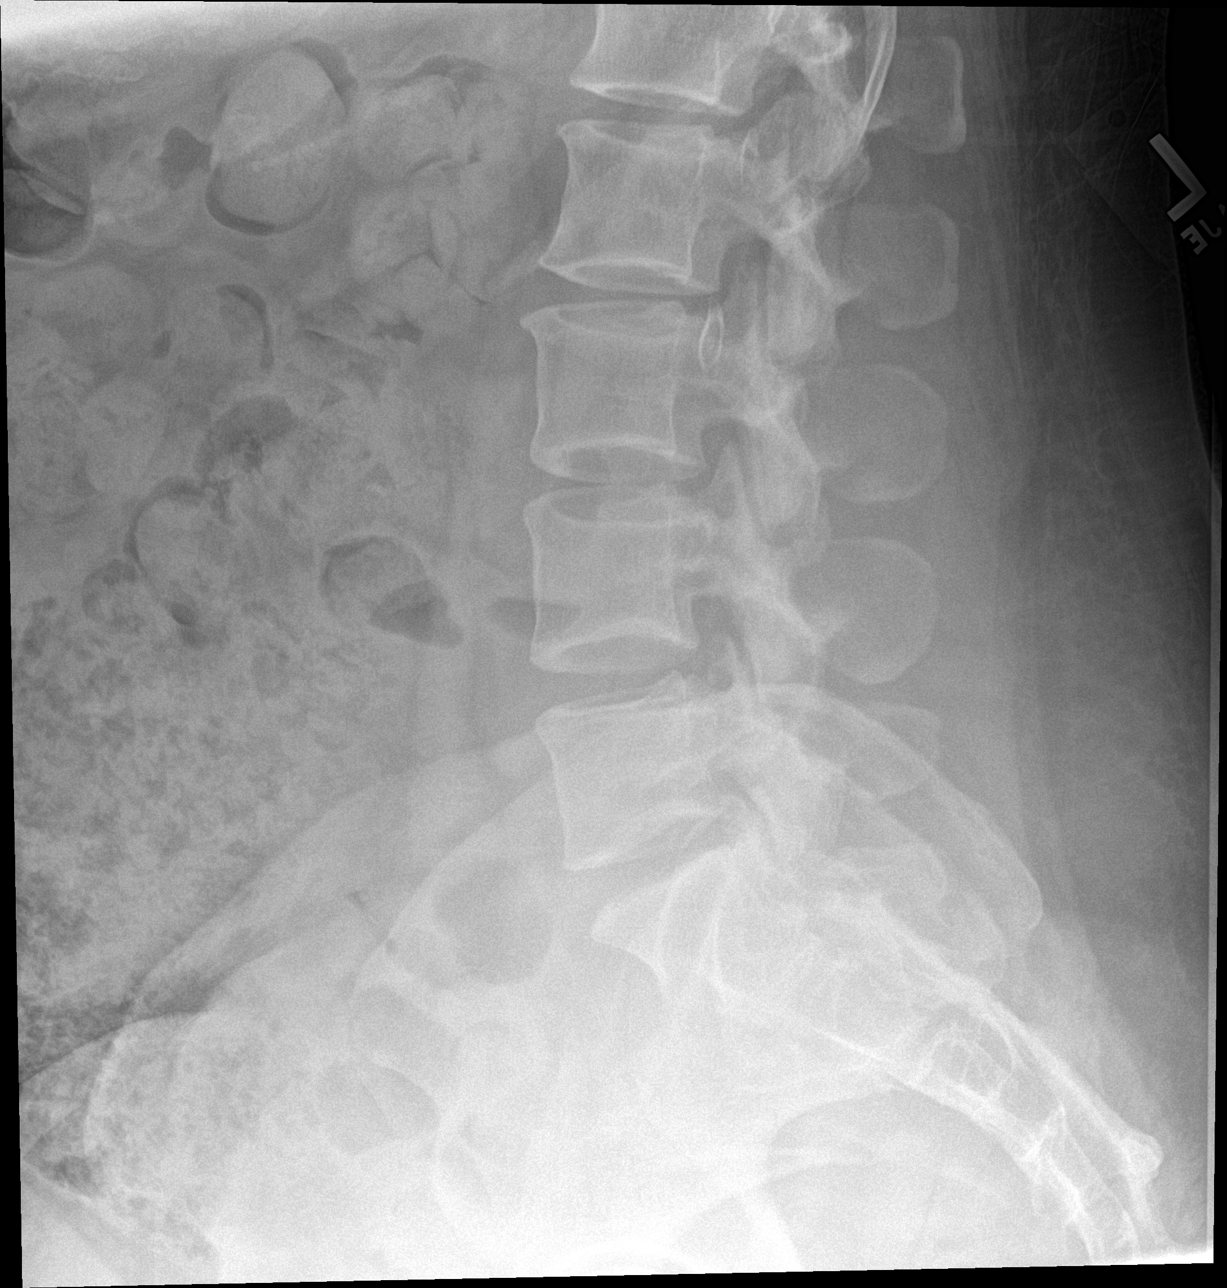

[3 of 3 positions shown; findings below may reference images not displayed]

FINDINGS: There are 5 lumbar type vertebral bodies. The alignment is normal.
No evidence of acute fracture or pars defect. There is stable
intervertebral spurring, greatest at L2-3. The disc spaces remain
relatively preserved.
IMPRESSION: No acute findings or significant changes. Mild intervertebral
spurring.

## 2019-09-13 IMAGING — CT CT ABD-PELV W/ CM
2 of 4 series · 16 of 46 positions shown, 18 images · IV contrast (APPLIED)
Comparison: None.

CLINICAL DATA: Right lower pelvic lump.  Question hernia.

EXAM:
CT ABDOMEN AND PELVIS WITH CONTRAST
TECHNIQUE: Multidetector CT imaging of the abdomen and pelvis was performed
using the standard protocol following bolus administration of
intravenous contrast.
CONTRAST:  100mL FAV3ST-X00 IOPAMIDOL (FAV3ST-X00) INJECTION 61%

[Series 2: routine abd/pel with · axial · 0.75mm/px · z∈[-985,-470]mm · 13 of 113 slices shown, 15 images]
[im 5/113  soft-tissue]
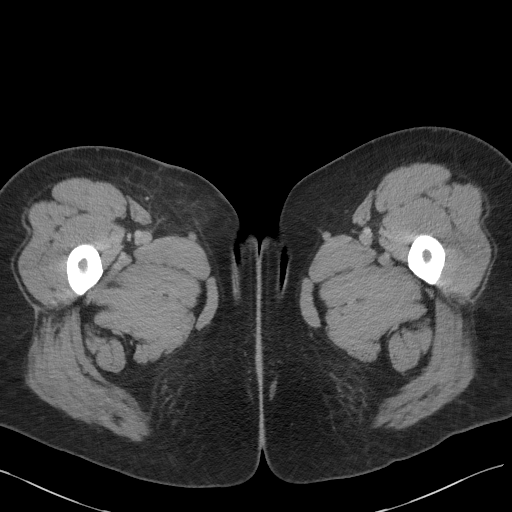
[im 5/113  bone]
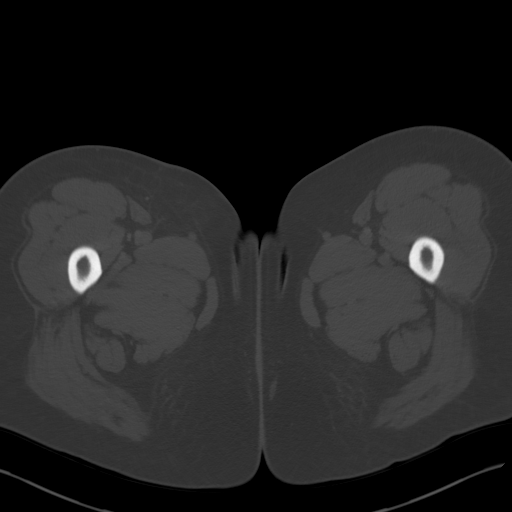
[im 14/113  soft-tissue]
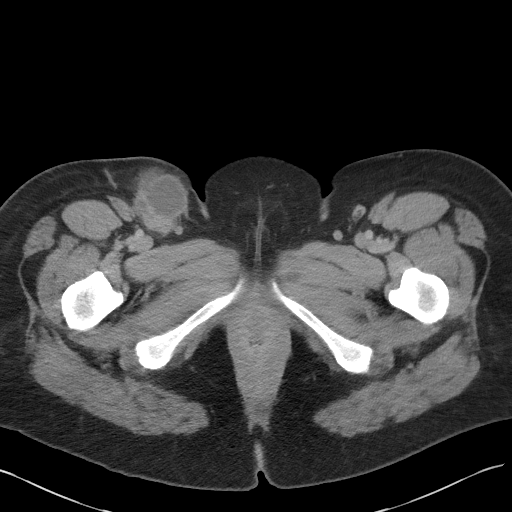
[im 23/113  soft-tissue]
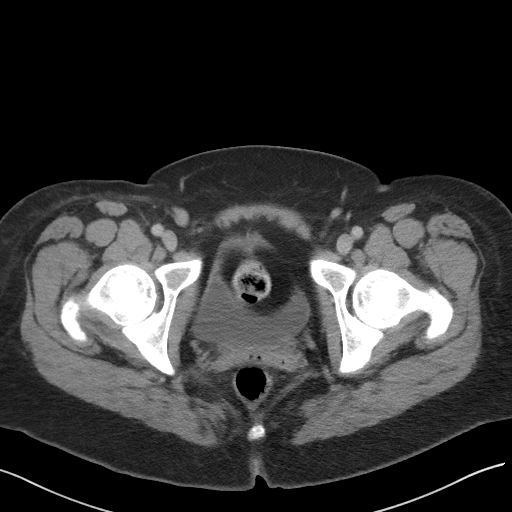
[im 32/113  soft-tissue]
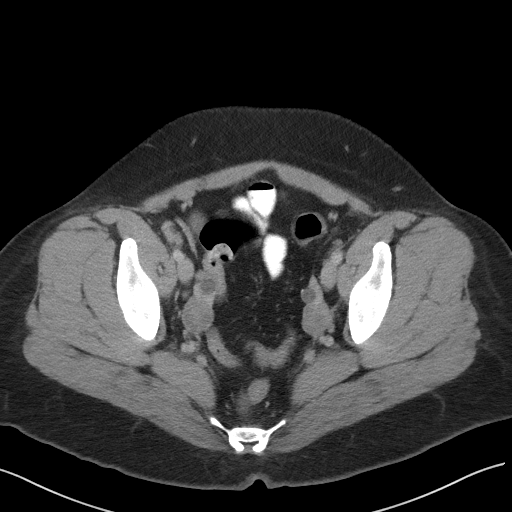
[im 41/113  soft-tissue]
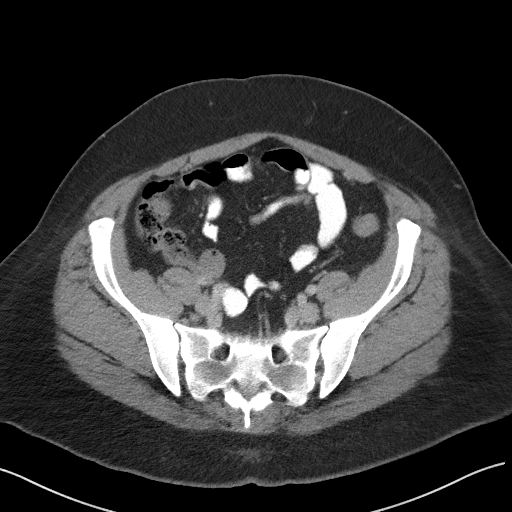
[im 50/113  soft-tissue]
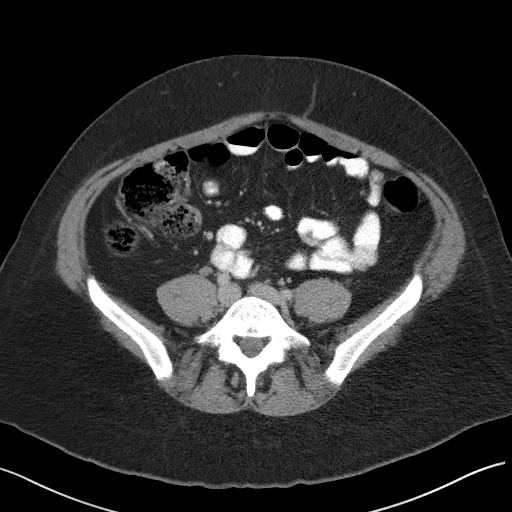
[im 59/113  soft-tissue]
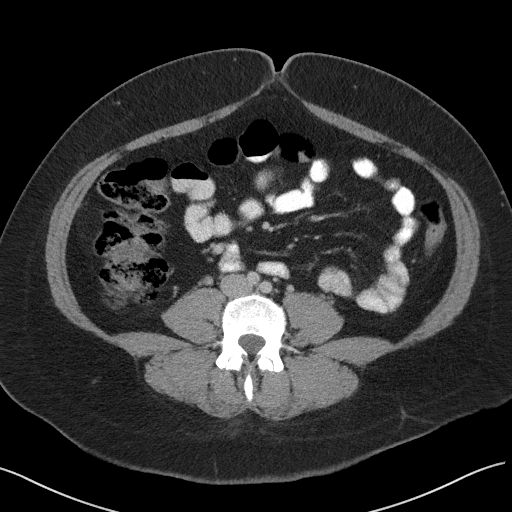
[im 63/113  soft-tissue]
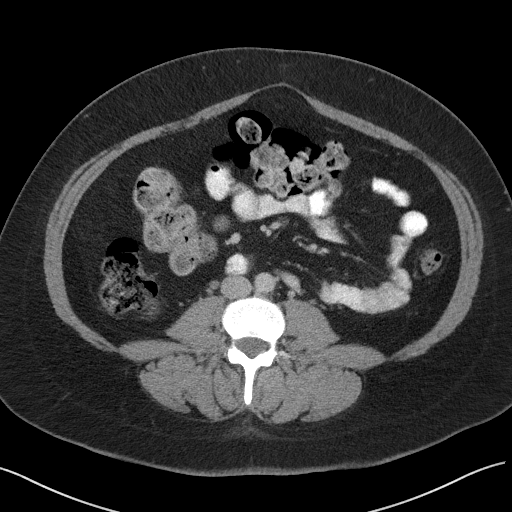
[im 72/113  soft-tissue]
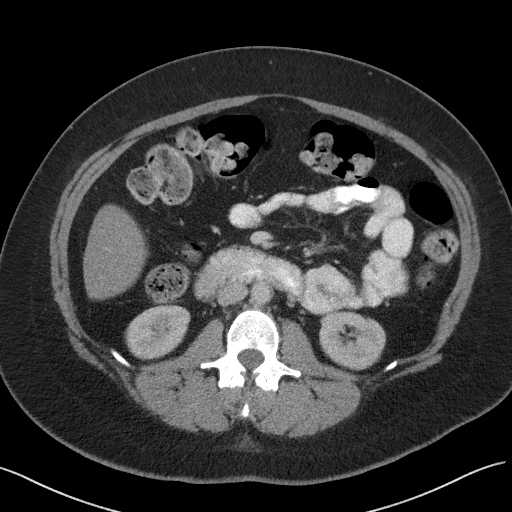
[im 72/113  bone]
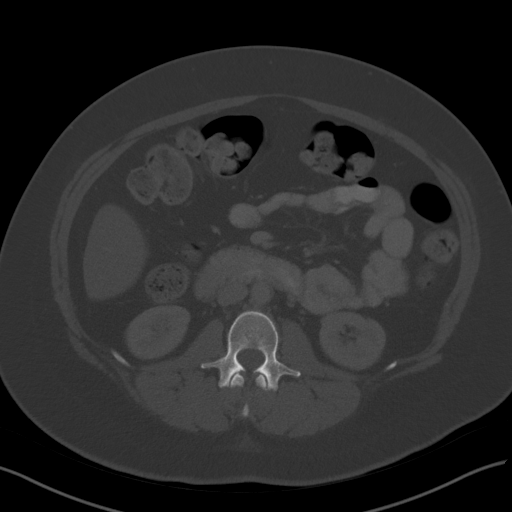
[im 81/113  soft-tissue]
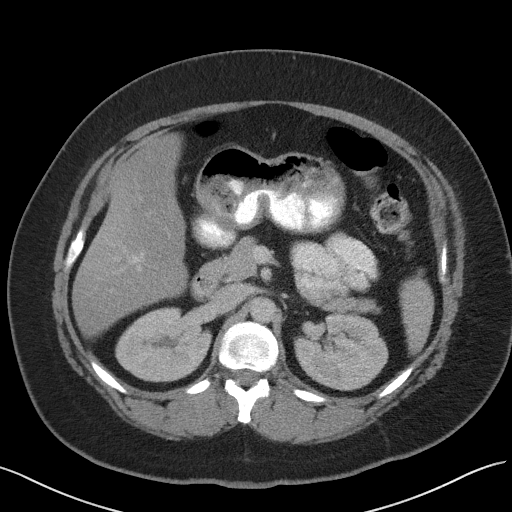
[im 90/113  soft-tissue]
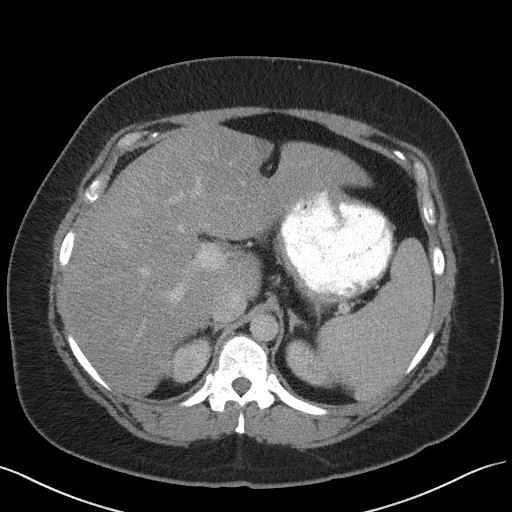
[im 99/113  soft-tissue]
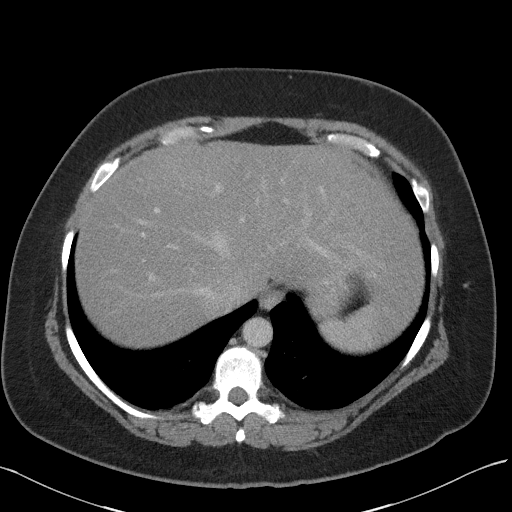
[im 108/113  soft-tissue]
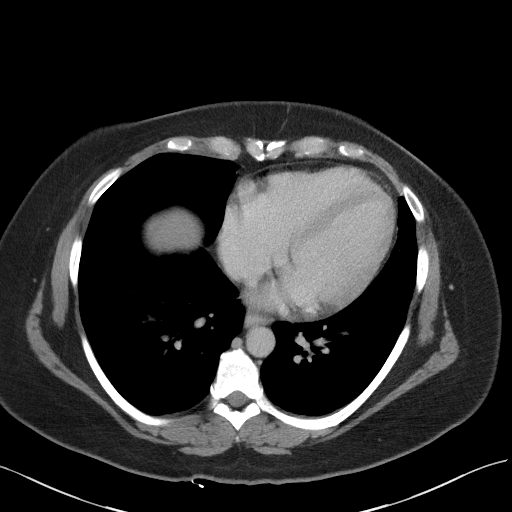

[Series 5: coronal st · coronal · 0.85mm/px · 3 of 108 slices shown]
[im 36/108  soft-tissue]
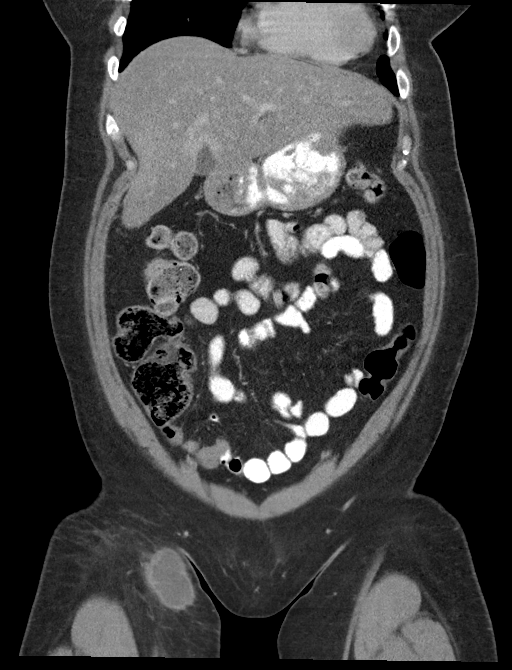
[im 48/108  soft-tissue]
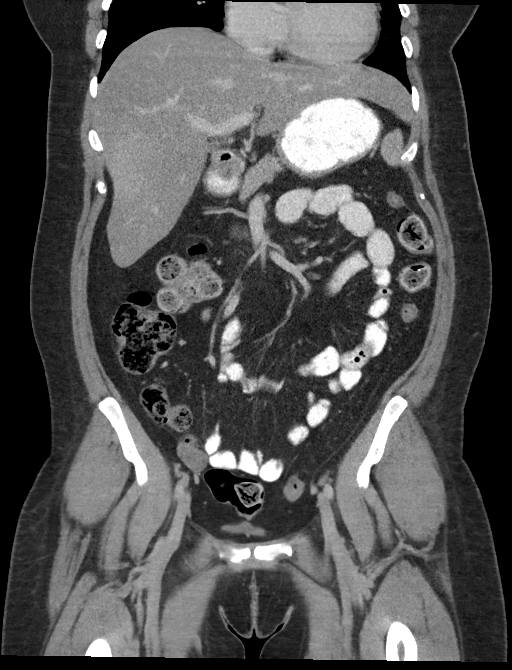
[im 60/108  soft-tissue]
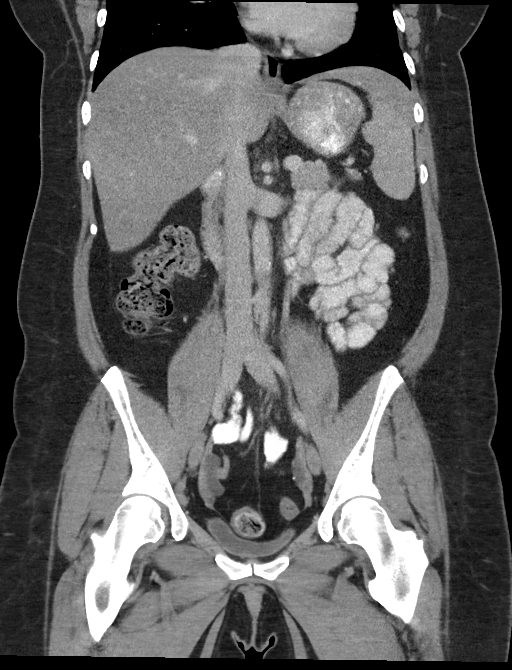

[16 of 46 positions shown; findings below may reference images not displayed]

FINDINGS: Lower chest: Lung bases are clear. No effusions. Heart is normal
size.

Hepatobiliary: Fatty infiltration of the liver. No focal abnormality
or biliary ductal dilatation. Gallbladder unremarkable.

Pancreas: No focal abnormality or ductal dilatation.

Spleen: No focal abnormality.  Normal size.

Adrenals/Urinary Tract: No adrenal abnormality. No focal renal
abnormality. No stones or hydronephrosis. Urinary bladder is
unremarkable.

Stomach/Bowel: Moderate stool throughout the colon. Normal appendix.
Stomach, large and small bowel grossly unremarkable.

Vascular/Lymphatic: No evidence of aneurysm or adenopathy.

Reproductive: Prior hysterectomy. Multiple follicles noted in the
ovaries bilaterally. No adnexal masses.

Other: No free fluid or free air. Low-density fluid collection noted
in the right inguinal region measuring 3.5 x 3.2 cm most compatible
with soft tissue abscess versus necrotic or abscess lymph node.
Mildly prominent adjacent inguinal lymph nodes.

Musculoskeletal: No acute bony abnormality.
IMPRESSION: Palpable lump in the right inguinal region represents a 3.5 x 3.2 cm
fluid collection extending from near the skin surface deep to mildly
prominent right inguinal lymph nodes. This could represent soft
tissue abscess or necrotic/abscessed right inguinal lymph node.

Moderate stool burden throughout the colon.

Fatty infiltration of the liver.

## 2022-01-24 ENCOUNTER — Other Ambulatory Visit: Payer: Self-pay | Admitting: Podiatry

## 2022-01-24 DIAGNOSIS — M722 Plantar fascial fibromatosis: Secondary | ICD-10-CM

## 2022-02-03 ENCOUNTER — Ambulatory Visit
Admission: RE | Admit: 2022-02-03 | Discharge: 2022-02-03 | Disposition: A | Discharge status: Home / Self Care | Payer: Medicaid Other | Source: Ambulatory Visit | Attending: Podiatry | Admitting: Podiatry

## 2022-02-03 DIAGNOSIS — M722 Plantar fascial fibromatosis: Secondary | ICD-10-CM | POA: Insufficient documentation

## 2022-05-26 ENCOUNTER — Other Ambulatory Visit: Payer: Self-pay | Admitting: Podiatry

## 2022-05-28 ENCOUNTER — Other Ambulatory Visit: Payer: Self-pay | Admitting: Podiatry

## 2022-05-29 ENCOUNTER — Encounter: Payer: Self-pay | Admitting: Podiatry

## 2022-06-03 NOTE — Discharge Instructions (Signed)
[facility]  POST OPERATIVE INSTRUCTIONS FOR DR. FOWLER AND [facility] P[facility]PART[facility]   Take your medication as prescribed.  Pain medication should be taken only as needed.  Keep the dressing clean, dry and intact.  Keep your foot elevated above the heart level for the first 48 hours.  Walking to the bathroom and brief periods of walking are acceptable, unless we have instructed you to be non-weight bearing.  Always wear your post-op shoe when walking.  Always use your crutches if you are to be non-weight bearing.  Do not take a shower. Baths are permissible as long as the foot is kept out of the water.   Every hour you are awake:  Bend your knee 15 times. Flex foot 15 times Massage calf 15 times  Call [facility] ([phone]) if any of the following problems occur: You develop a temperature or fever. The bandage becomes saturated with blood. Medication does not stop your pain. Injury of the foot occurs. Any symptoms of infection including redness, odor, or red streaks running from wound. 

## 2022-06-05 ENCOUNTER — Encounter
Admission: RE | Disposition: A | Discharge status: Home / Self Care | Payer: Self-pay | Source: Home / Self Care | Attending: Podiatry

## 2022-06-05 ENCOUNTER — Ambulatory Visit: Payer: Medicaid Other | Admitting: General Practice

## 2022-06-05 ENCOUNTER — Other Ambulatory Visit: Payer: Self-pay

## 2022-06-05 ENCOUNTER — Ambulatory Visit
Admission: RE | Admit: 2022-06-05 | Discharge: 2022-06-05 | Disposition: A | Discharge status: Home / Self Care | Payer: Medicaid Other | Attending: Podiatry | Admitting: Podiatry

## 2022-06-05 ENCOUNTER — Encounter: Payer: Self-pay | Admitting: Podiatry

## 2022-06-05 DIAGNOSIS — M722 Plantar fascial fibromatosis: Secondary | ICD-10-CM | POA: Insufficient documentation

## 2022-06-05 DIAGNOSIS — I10 Essential (primary) hypertension: Secondary | ICD-10-CM | POA: Insufficient documentation

## 2022-06-05 DIAGNOSIS — Z8249 Family history of ischemic heart disease and other diseases of the circulatory system: Secondary | ICD-10-CM | POA: Insufficient documentation

## 2022-06-05 DIAGNOSIS — Z79899 Other long term (current) drug therapy: Secondary | ICD-10-CM | POA: Insufficient documentation

## 2022-06-05 DIAGNOSIS — F319 Bipolar disorder, unspecified: Secondary | ICD-10-CM | POA: Insufficient documentation

## 2022-06-05 DIAGNOSIS — F419 Anxiety disorder, unspecified: Secondary | ICD-10-CM | POA: Diagnosis not present

## 2022-06-05 DIAGNOSIS — F17210 Nicotine dependence, cigarettes, uncomplicated: Secondary | ICD-10-CM | POA: Diagnosis not present

## 2022-06-05 HISTORY — DX: Unspecified osteoarthritis, unspecified site: M19.90

## 2022-06-05 HISTORY — DX: Essential (primary) hypertension: I10

## 2022-06-05 HISTORY — PX: PLANTAR FASCIA RELEASE: SHX2239

## 2022-06-05 SURGERY — RELEASE, FASCIA, PLANTAR
Anesthesia: General | Site: Foot | Laterality: Right

## 2022-06-05 MED ORDER — DEXMEDETOMIDINE HCL IN NACL 400 MCG/100ML IV SOLN
INTRAVENOUS | Status: DC | PRN
  Administered 2022-06-05 (×5): 4 ug via INTRAVENOUS

## 2022-06-05 MED ORDER — BUPIVACAINE HCL (PF) 0.5 % IJ SOLN
INTRAMUSCULAR | Status: DC | PRN
  Administered 2022-06-05: 20 mL via PERINEURAL

## 2022-06-05 MED ORDER — LACTATED RINGERS IV SOLN: INTRAVENOUS | Status: DC

## 2022-06-05 MED ORDER — FENTANYL CITRATE PF 50 MCG/ML IJ SOSY: 25.0000 ug | PREFILLED_SYRINGE | INTRAMUSCULAR | Status: DC | PRN

## 2022-06-05 MED ORDER — OXYCODONE HCL 5 MG/5ML PO SOLN: 5.0000 mg | Freq: Once | ORAL | Status: DC | PRN

## 2022-06-05 MED ORDER — 0.9 % SODIUM CHLORIDE (POUR BTL) OPTIME
TOPICAL | Status: DC | PRN
  Administered 2022-06-05: 500 mL

## 2022-06-05 MED ORDER — LIDOCAINE HCL (CARDIAC) PF 100 MG/5ML IV SOSY
PREFILLED_SYRINGE | INTRAVENOUS | Status: DC | PRN
  Administered 2022-06-05: 60 mg via INTRATRACHEAL

## 2022-06-05 MED ORDER — PROPOFOL 10 MG/ML IV BOLUS
INTRAVENOUS | Status: DC | PRN
  Administered 2022-06-05: 20 mg via INTRAVENOUS
  Administered 2022-06-05 (×2): 30 mg via INTRAVENOUS
  Administered 2022-06-05: 20 mg via INTRAVENOUS
  Administered 2022-06-05: 30 mg via INTRAVENOUS
  Administered 2022-06-05: 50 mg via INTRAVENOUS
  Administered 2022-06-05: 40 mg via INTRAVENOUS

## 2022-06-05 MED ORDER — CEFAZOLIN SODIUM-DEXTROSE 2-4 GM/100ML-% IV SOLN
2.0000 g | INTRAVENOUS | Status: AC
  Administered 2022-06-05: 2 g via INTRAVENOUS

## 2022-06-05 MED ORDER — ACETAMINOPHEN 160 MG/5ML PO SOLN: 960.0000 mg | Freq: Once | ORAL | Status: AC

## 2022-06-05 MED ORDER — HYDROCODONE-ACETAMINOPHEN 5-325 MG PO TABS: 1.0000 | ORAL_TABLET | Freq: Four times a day (QID) | ORAL | 0 refills | Status: AC | PRN

## 2022-06-05 MED ORDER — BUPIVACAINE LIPOSOME 1.3 % IJ SUSP
INTRAMUSCULAR | Status: DC | PRN
  Administered 2022-06-05: 10 mL

## 2022-06-05 MED ORDER — MIDAZOLAM HCL 5 MG/5ML IJ SOLN
INTRAMUSCULAR | Status: DC | PRN
  Administered 2022-06-05: 1 mg via INTRAVENOUS
  Administered 2022-06-05: 2 mg via INTRAVENOUS

## 2022-06-05 MED ORDER — DEXAMETHASONE SODIUM PHOSPHATE 4 MG/ML IJ SOLN
INTRAMUSCULAR | Status: DC | PRN
  Administered 2022-06-05: 2 mg via PERINEURAL

## 2022-06-05 MED ORDER — ONDANSETRON HCL 4 MG/2ML IJ SOLN
INTRAMUSCULAR | Status: DC | PRN
  Administered 2022-06-05: 4 mg via INTRAVENOUS

## 2022-06-05 MED ORDER — OXYCODONE HCL 5 MG PO TABS: 5.0000 mg | ORAL_TABLET | Freq: Once | ORAL | Status: DC | PRN

## 2022-06-05 MED ORDER — ASPIRIN 81 MG PO TBEC: 81.0000 mg | DELAYED_RELEASE_TABLET | Freq: Two times a day (BID) | ORAL | 0 refills | Status: AC

## 2022-06-05 MED ORDER — FENTANYL CITRATE (PF) 100 MCG/2ML IJ SOLN
INTRAMUSCULAR | Status: DC | PRN
  Administered 2022-06-05: 50 ug via INTRAVENOUS

## 2022-06-05 MED ORDER — ONDANSETRON HCL 4 MG/2ML IJ SOLN: 4.0000 mg | Freq: Once | INTRAMUSCULAR | Status: DC | PRN

## 2022-06-05 MED ORDER — ACETAMINOPHEN 500 MG PO TABS
1000.0000 mg | ORAL_TABLET | Freq: Once | ORAL | Status: AC
  Administered 2022-06-05: 1000 mg via ORAL

## 2022-06-05 SURGICAL SUPPLY — 28 items
BNDG CMPR 5X4 CHSV STRCH STRL (GAUZE/BANDAGES/DRESSINGS) ×1
BNDG CMPR STD VLCR NS LF 5.8X4 (GAUZE/BANDAGES/DRESSINGS) ×1
BNDG COHESIVE 4X5 TAN STRL LF (GAUZE/BANDAGES/DRESSINGS) ×1 IMPLANT
BNDG ELASTIC 4X5.8 VLCR NS LF (GAUZE/BANDAGES/DRESSINGS) ×1 IMPLANT
BOOT STEPPER DURA MED (SOFTGOODS) IMPLANT
CANISTER SUCT 1200ML W/VALVE (MISCELLANEOUS) ×1 IMPLANT
COVER LIGHT HANDLE UNIVERSAL (MISCELLANEOUS) ×2 IMPLANT
CUFF TOURN SGL QUICK 18X4 (TOURNIQUET CUFF) IMPLANT
DURAPREP 26ML APPLICATOR (WOUND CARE) ×1 IMPLANT
ELECT REM PT RETURN 9FT ADLT (ELECTROSURGICAL) ×1
ELECTRODE REM PT RTRN 9FT ADLT (ELECTROSURGICAL) ×1 IMPLANT
GAUZE SPONGE 4X4 12PLY STRL (GAUZE/BANDAGES/DRESSINGS) ×1 IMPLANT
GAUZE XEROFORM 1X8 LF (GAUZE/BANDAGES/DRESSINGS) ×1 IMPLANT
GLOVE BIOGEL PI IND STRL 7.5 (GLOVE) ×1 IMPLANT
GLOVE SURG SS PI 7.0 STRL IVOR (GLOVE) ×1 IMPLANT
GOWN STRL REUS W/ TWL LRG LVL3 (GOWN DISPOSABLE) ×2 IMPLANT
GOWN STRL REUS W/TWL LRG LVL3 (GOWN DISPOSABLE) ×2
K-WIRE DBL END TROCAR 6X.062 (WIRE) ×1
KIT CARPAL TUNNEL (MISCELLANEOUS) ×1
KIT PRC PRB RTRGD 3ANG KNF HND (MISCELLANEOUS) IMPLANT
KIT TURNOVER KIT A (KITS) ×1 IMPLANT
KWIRE DBL END TROCAR 6X.062 (WIRE) IMPLANT
NS IRRIG 500ML POUR BTL (IV SOLUTION) ×1 IMPLANT
PACK EXTREMITY ARMC (MISCELLANEOUS) ×1 IMPLANT
PADDING CAST BLEND 4X4 NS (MISCELLANEOUS) ×3 IMPLANT
STOCKINETTE IMPERVIOUS LG (DRAPES) ×1 IMPLANT
SUT ETHILON 3-0 (SUTURE) IMPLANT
WAND TOPAZ MICRO DEBRIDER (MISCELLANEOUS) IMPLANT

## 2022-06-05 NOTE — Anesthesia Preprocedure Evaluation (Signed)
Anesthesia Evaluation  Patient identified by MRN, date of birth, ID band Patient awake    Reviewed: Allergy & Precautions, NPO status , Patient's Chart, lab work & pertinent test results  History of Anesthesia Complications Negative for: history of anesthetic complications  Airway Mallampati: II  TM Distance: >3 FB Neck ROM: Full    Dental no notable dental hx. (+) Teeth Intact   Pulmonary neg sleep apnea, neg COPD, Current Smoker and Patient abstained from smoking.   Pulmonary exam normal breath sounds clear to auscultation       Cardiovascular Exercise Tolerance: Good METShypertension, Pt. on medications (-) CAD and (-) Past MI (-) dysrhythmias  Rhythm:Regular Rate:Normal - Systolic murmurs    Neuro/Psych  PSYCHIATRIC DISORDERS Anxiety  Bipolar Disorder   negative neurological ROS     GI/Hepatic ,neg GERD  ,,(+)     (-) substance abuse    Endo/Other  neg diabetes    Renal/GU negative Renal ROS     Musculoskeletal   Abdominal  (+) + obese  Peds  Hematology   Anesthesia Other Findings Past Medical History: No date: ADHD No date: Anxiety No date: Arthritis No date: Bipolar 1 disorder (HCC) No date: Borderline personality disorder (HCC) No date: Hypertension  Reproductive/Obstetrics                              Anesthesia Physical Anesthesia Plan  ASA: 2  Anesthesia Plan: General   Post-op Pain Management: Regional block, Fentanyl IV and Tylenol PO (pre-op)   Induction: Intravenous  PONV Risk Score and Plan: 2 and Ondansetron, Dexamethasone and Midazolam  Airway Management Planned: LMA  Additional Equipment: None  Intra-op Plan:   Post-operative Plan: Extubation in OR  Informed Consent: I have reviewed the patients History and Physical, chart, labs and discussed the procedure including the risks, benefits and alternatives for the proposed anesthesia with the patient or  authorized representative who has indicated his/her understanding and acceptance.     Dental advisory given  Plan Discussed with: CRNA and Surgeon  Anesthesia Plan Comments: (Discussed risks of anesthesia with patient, including PONV, sore throat, lip/dental/eye damage. Rare risks discussed as well, such as cardiorespiratory and neurological sequelae, and allergic reactions. Discussed the role of CRNA in patient's perioperative care. Patient understands. Discussed r/b/a of popliteal nerve block, including:  - bleeding, infection, nerve damage - poor or non functioning block. - reactions and toxicity to local anesthetic Patient understands. )         Anesthesia Quick Evaluation

## 2022-06-05 NOTE — Anesthesia Procedure Notes (Signed)
Anesthesia Regional Block: Popliteal block   Pre-Anesthetic Checklist: , timeout performed,  Correct Patient, Correct Site, Correct Laterality,  Correct Procedure, Correct Position, site marked,  Risks and benefits discussed,  Surgical consent,  Pre-op evaluation,  At surgeon's request and post-op pain management  Laterality: Lower and Right  Prep: chloraprep       Needles:  Injection technique: Single-shot  Needle Type: Echogenic Needle     Needle Length: 9cm  Needle Gauge: 21     Additional Needles:   Procedures:,,,, ultrasound used (permanent image in chart),,    Narrative:  Injection made incrementally with aspirations every 5 mL.  Performed by: Personally  Anesthesiologist: Arita Miss, MD  Additional Notes: Patient's chart reviewed and they were deemed appropriate candidate for procedure, at surgeon's request. Patient educated about risks, benefits, and alternatives of the block including but not limited to: temporary or permanent nerve damage, bleeding, infection, damage to surround tissues, block failure, local anesthetic toxicity. Patient expressed understanding. A formal time-out was conducted consistent with institution rules.  Monitors were applied, and minimal sedation used. The site was prepped with skin prep and allowed to dry, and sterile gloves were used. A high frequency linear ultrasound probe with probe cover was utilized throughout. Popliteal artery pulsatile and visualized in popliteal fossa along with adjacent sciatic nerve and its branch point, which appeared anatomically normal, local anesthetic injected around them just proximal to the branch point, and echogenic block needle trajectory was monitored throughout. Aspiration performed every 17m. Blood vessels were avoided. All injections were performed without resistance and free of blood and paresthesias. The patient tolerated the procedure well.  Injectate: 240m0.5% bupivacaine + '2mg'$  decadron

## 2022-06-05 NOTE — Anesthesia Postprocedure Evaluation (Signed)
Anesthesia Post Note  Patient: Hayley Smith  Procedure(s) Performed: PLANTAR FASCIA RELEASE (Right: Foot)  Patient location during evaluation: PACU Anesthesia Type: General Level of consciousness: awake and alert Pain management: pain level controlled Vital Signs Assessment: post-procedure vital signs reviewed and stable Respiratory status: spontaneous breathing, nonlabored ventilation, respiratory function stable and patient connected to nasal cannula oxygen Cardiovascular status: blood pressure returned to baseline and stable Postop Assessment: no apparent nausea or vomiting Anesthetic complications: no   No notable events documented.   Last Vitals:  Vitals:   06/05/22 1115 06/05/22 1130  BP: 111/77 (!) 130/90  Pulse: 74 70  Resp: 13 13  Temp: (!) 36.3 C (!) 36.4 C  SpO2: 96% 97%    Last Pain:  Vitals:   06/05/22 1130  TempSrc:   PainSc: 0-No pain                 Arita Miss

## 2022-06-05 NOTE — H&P (Signed)
HISTORY AND PHYSICAL INTERVAL NOTE:  06/05/2022  10:12 AM  Hayley Smith  has presented today for surgery, with the diagnosis of M72.2 - Plantar fasciitis.  The various methods of treatment have been discussed with the patient.  No guarantees were given.  After consideration of risks, benefits and other options for treatment, the patient has consented to surgery.  I have reviewed the patients' chart and labs. Discussed risk of smoking preop, patient instructed numerous times of complications that can occur when smoking prior and after procedure.  Patient aware of risks and would like to still proceed.   PROCEDURE: RIGHT ENDOSCOPIC PLANTAR FASCIIOTOMY WITH TOPAZ MICRODEBRIDEMENT   A history and physical examination was performed in my office.  The patient was reexamined.  There have been no changes to this history and physical examination.  Caroline More, DPM

## 2022-06-05 NOTE — Transfer of Care (Signed)
Immediate Anesthesia Transfer of Care Note  Patient: Hayley Smith  Procedure(s) Performed: PLANTAR FASCIA RELEASE (Right: Foot)  Patient Location: PACU  Anesthesia Type: General  Level of Consciousness: awake, alert  and patient cooperative  Airway and Oxygen Therapy: Patient Spontanous Breathing and Patient connected to supplemental oxygen  Post-op Assessment: Post-op Vital signs reviewed, Patient's Cardiovascular Status Stable, Respiratory Function Stable, Patent Airway and No signs of Nausea or vomiting  Post-op Vital Signs: Reviewed and stable  Complications: No notable events documented.

## 2022-06-05 NOTE — Op Note (Signed)
PODIATRY / FOOT AND ANKLE SURGERY OPERATIVE REPORT    SURGEON: Caroline More, DPM  PRE-OPERATIVE DIAGNOSIS:  1.  Chronic right plantar fasciitis  POST-OPERATIVE DIAGNOSIS: Same  PROCEDURE(S): Right endoscopic plantar fasciotomy with Topaz micro debridement  HEMOSTASIS: Right ankle tourniquet  ANESTHESIA: MAC  ESTIMATED BLOOD LOSS: 5 cc  FINDING(S): 1.  Thickened plantar fascia  PATHOLOGY/SPECIMEN(S): None  INDICATIONS:   Hayley Smith is a 42 y.o. female who presents with chronic right heel pain.  Despite conservative measures patient has continued to have heel pain for several months to up to a couple of years now.  Patient had MRI taken which showed mild plantar fasciitis but did not show any calcaneal spur or marrow edema in the calcaneus.  Patient has exhausted conservative care and presents for surgical intervention today.  All treatment options were discussed with the patient both conservative and surgical attempts at correction clean potential risks and complications at this time patient is elected for surgical intervention consisting of right endoscopic plantar fasciotomy with Topaz micro debridement.  No guarantees given.  Consent obtained prior to procedure.  Also discussed prior to surgical intervention and at preoperative visit need for smoking cessation.  Patient knows risks of continued smoking during pre and post surgery.  Patient knows these risks and would like to proceed with surgery anyway.  DESCRIPTION: After obtaining full informed written consent, the patient was brought back to the operating room and placed supine upon the operating table.  A preoperative popliteal nerve block was performed by anesthesia.  The patient received IV antibiotics prior to induction.  After obtaining adequate anesthesia, the patient was prepped and draped in the standard fashion.  An Esmarch bandage was used to exsanguinate the right lower extremity and pneumatic ankle tourniquet was  inflated.  Attention was directed to the right medial heel where a small incision was made to this area of approximately 1 to 2 cm.  The incision was made along the course of the plantar fascia slightly distal to its insertion point on the calcaneus.  Blunt dissection was continued through the subcutaneous tissue to the level of the plantar fascia.  A curved type instrument was placed to the area as a flat type spatula going across the plantar fascia from the plantar medial aspect of the heel to the lateral aspect of the heel.  The skin was then tented at the lateral heel in this area.  A small percutaneous incision was made in this area as well and the instrument was passed from the medial heel to the lateral heel open incision.  At this time the instrumentation was removed.  The obturator and cannula was then placed connecting the 2 incisions together through the medial plantar to plantar lateral heel.  The obturator was removed leaving the cannula in place.  Cotton tip applicator was then applied through the area to clear the view.  The scope was then placed through the lateral heel portal.  The plantar fascia was then identified.  The plantar fascia appeared to be thickened to this area but did not appear to have any tears presents in this particular section that was identified.  The hook blade was then placed through the medial heel portal.  Approximately two thirds of the central band and all the medial band of the plantar fascia was then released with the hook blade.  The extensor digitorum brevis muscle belly was identified at indicating complete release of the plantar fascia in these areas.  The lateral section  of the plantar fascia was left intact per protocol.  The surgical site was flushed with copious amounts of normal sterile saline.  The obturator was then removed.  The surgical incisions were then reapproximated well coapted with 3-0 nylon in a combination of simple and horizontal mattress type  stitching.  Attention was then directed to the plantar aspect of the right heel where a 5 x 4 grid was made with the skin marker.  This was made in the area near the plantar fascia near the insertion point of the calcaneus.  A 0.062 K wire was then used to puncture small holes into these areas.  The Topaz microdebrider instrument was then used in each hole that was made in approximately 3 different directions debriding the plantar fascia further.  Instrumentation was then removed after completion.  An additional 10 cc of Exparel was injected about the operative areas.  Postoperative dressing was then applied consisting of Xeroform to the incisional areas followed by 4 x 4 gauze, gauze roll, Ace wrap, tall cam boot.  The pneumatic ankle tourniquet was deflated during this time and a prompt hyperemic response was noted to all digits of the right foot.  The patient tolerated the procedure and anesthesia well and transferred to recovery room vital signs stable vascular status intact all toes the right foot.  Following a period of postoperative monitoring the patient be discharged home with the appropriate orders, instructions, and medications.  Patient is to remain nonweightbearing at all times to the right lower extremity for the next 2 weeks using knee scooter, wheelchair, walker, or crutches.  Patient to follow-up in clinic 1 week after surgical date.  COMPLICATIONS: None  CONDITION: Good, stable  Caroline More, DPM

## 2022-06-06 ENCOUNTER — Encounter: Payer: Self-pay | Admitting: Podiatry

## 2022-06-06 NOTE — Progress Notes (Signed)
Assisted Zak ANMD with right, popliteal, ultrasound guided block. Side rails up, monitors on throughout procedure. See vital signs in flow sheet. Tolerated Procedure well.

## 2022-07-29 ENCOUNTER — Encounter: Payer: Self-pay | Admitting: Family Medicine

## 2022-07-30 ENCOUNTER — Encounter: Payer: Self-pay | Admitting: Gastroenterology

## 2022-09-04 ENCOUNTER — Encounter: Payer: Self-pay | Admitting: Gastroenterology

## 2022-09-04 ENCOUNTER — Other Ambulatory Visit: Payer: Self-pay

## 2022-09-04 ENCOUNTER — Ambulatory Visit (INDEPENDENT_AMBULATORY_CARE_PROVIDER_SITE_OTHER): Payer: Medicaid Other | Admitting: Gastroenterology

## 2022-09-04 VITALS — BP 137/99 | HR 99 | Temp 98.3°F | Ht 64.0 in | Wt 212.2 lb

## 2022-09-04 DIAGNOSIS — K625 Hemorrhage of anus and rectum: Secondary | ICD-10-CM

## 2022-09-04 DIAGNOSIS — K581 Irritable bowel syndrome with constipation: Secondary | ICD-10-CM | POA: Diagnosis not present

## 2022-09-04 MED ORDER — NA SULFATE-K SULFATE-MG SULF 17.5-3.13-1.6 GM/177ML PO SOLN: 354.0000 mL | Freq: Once | ORAL | 0 refills | Status: AC

## 2022-09-04 NOTE — Progress Notes (Signed)
Wyline Mood MD, MRCP(U.K) [address]  [address]  [place], [place] [address]  Main: [phone]  Fax: [phone]   Gastroenterology Consultation  Referring Provider:     Lorn Junes, FNP Primary Care Physician:  Alba Cory, MD Primary Gastroenterologist:  Dr. Wyline Mood  Reason for Consultation:    Diarrhea and constipation        HPI:   Hayley Smith is a 42 y.o. y/o female referred for consultation & management  by Dr. Carlynn Purl, Danna Hefty, MD.     She states that for few years has been having issue of constipation alternating diarrhea.  She states that she does not have a bowel movement for many days and following which has profuse diarrhea and had a large bowel movement.  She has increased her dietary fiber which has not helped.  Denies any family history of colon cancer but she has a family history of colon polyps.  She has noticed blood on the tissue paper as well as in the toilet bowel and never had a colonoscopy.  Denies any unintentional weight loss.  She has tried over-the-counter medication such as MiraLAX which has failed.  She does attest that she has abdominal discomfort prior to bowel movement which is relieved afterwards.  Past Medical History:  Diagnosis Date   ADHD    Anxiety    Arthritis    Bipolar 1 disorder (HCC)    Borderline personality disorder (HCC)    Hypertension     Past Surgical History:  Procedure Laterality Date   ABDOMINAL HYSTERECTOMY     CYST REMOVAL HAND     PLANTAR FASCIA RELEASE Right 06/05/2022   Procedure: PLANTAR FASCIA RELEASE;  Surgeon: Rosetta Posner, DPM;  Location: [facility];  Service: Podiatry;  Laterality: Right;    Prior to Admission medications   Medication Sig Start Date End Date Taking? Authorizing Provider  atorvastatin (LIPITOR) 80 MG tablet Take 80 mg by mouth daily.    [provider]  buPROPion (WELLBUTRIN XL) 300 MG 24 hr tablet Take 300 mg by mouth daily.    [provider]  clonazePAM (KLONOPIN) 1 MG tablet Take 1 mg by mouth 2 (two) times daily. Patient not taking: Reported on 05/29/2022    [provider]  cyclobenzaprine (FLEXERIL) 5 MG tablet Take 1-2 tablets 3 times daily as needed 02/08/17   Enid Derry, PA-C  dexmethylphenidate (FOCALIN XR) 20 MG 24 hr capsule Take 30 mg by mouth daily. 30 mg in AM and 10 mg at lunch time    [provider]  diazepam (VALIUM) 5 MG tablet Take 5 mg by mouth every 6 (six) hours as needed for anxiety.    [provider]  divalproex (DEPAKOTE) 500 MG DR tablet Take 500 mg by mouth 2 (two) times daily. Patient not taking: Reported on 05/29/2022    [provider]  doxepin (SINEQUAN) 25 MG capsule Take 25 mg by mouth at bedtime.    [provider]  ketorolac (TORADOL) 10 MG tablet Take 1 tablet (10 mg total) by mouth every 8 (eight) hours. Patient not taking: Reported on 05/29/2022 07/02/16   Menshew, Charlesetta Ivory, PA-C  losartan (COZAAR) 25 MG tablet Take 25 mg by mouth daily.    [provider]  meloxicam (MOBIC) 15 MG tablet Take 15 mg by mouth daily. Patient not taking: Reported on 05/29/2022    [provider]  methocarbamol (ROBAXIN) 500 MG tablet Take one or 2  tablets 4 times a day as needed for muscle spasms. Patient not taking: Reported on 05/29/2022 09/19/15   Tommi Rumps, PA-C  methylphenidate 54 MG PO CR tablet Take 54 mg by mouth every morning. Patient not taking: Reported on 05/29/2022    [provider]  oxcarbazepine (TRILEPTAL) 600 MG tablet Take 600 mg by mouth 2 (two) times daily.    [provider]  QUEtiapine (SEROQUEL) 300 MG tablet Take 600 mg by mouth at bedtime.    [provider]    No family history on file.   Social History   Tobacco Use   Smoking status: Every Day    Packs/day: 0.50    Years: 27.00    Additional pack years: 0.00    Total pack years: 13.50    Types: Cigarettes   Smokeless tobacco:  Never  Substance Use Topics   Alcohol use: No   Drug use: No    Allergies as of 09/04/2022 - Review Complete 09/04/2022  Allergen Reaction Noted   Gabapentin Other (See Comments) 10/24/2015   Silicone Other (See Comments) 06/05/2022   Tape Other (See Comments) 06/05/2022   Penicillins Rash 03/02/2015    Review of Systems:    All systems reviewed and negative except where noted in HPI.   Physical Exam:  BP (!) 139/92   Pulse (!) 101   Temp 98.3 F (36.8 C) (Oral)   Ht 5\' 4"  (1.626 m)   Wt 212 lb 3.2 oz (96.3 kg)   BMI 36.42 kg/m  No LMP recorded. Patient has had a hysterectomy. Psych:  Alert and cooperative. Normal mood and affect. General:   Alert,  Well-developed, well-nourished, pleasant and cooperative in NAD Head:  Normocephalic and atraumatic. Eyes:  Sclera clear, no icterus.   Conjunctiva pink. Ears:  Normal auditory acuity. Neurologic:  Alert and oriented x3;  grossly normal neurologically. Psych:  Alert and cooperative. Normal mood and affect.  Imaging Studies: No results found.  Assessment and Plan:   Hayley Smith is a 42 y.o. y/o female has been referred for constipation alternating with diarrhea.  Her history of abdominal discomfort proceed during a bowel movement and longstanding constipation alternating with diarrhea suggests likely IBS constipation.  She is also noticed some blood in the tissue paper on the toilet bowl which could be from hemorrhoids but other causes need to be ruled out.  Hemoglobin is 13.8 g when checked in April 2024.  Plan 1.  High-fiber diet 2.  Trial of tenapanor samples provided for 2 weeks she has failed MiraLAX 3.  Diagnostic colonoscopy   I have discussed alternative options, risks & benefits,  which include, but are not limited to, bleeding, infection, perforation,respiratory complication & drug reaction.  The patient agrees with this plan & written consent will be obtained.     Follow up in 6-8 weeks with PA  Dr  Wyline Mood MD,MRCP(U.K)

## 2022-09-04 NOTE — Patient Instructions (Signed)
Please take Ibsrella 50 MG twice a day.

## 2022-09-16 ENCOUNTER — Encounter: Payer: Self-pay | Admitting: Gastroenterology

## 2022-09-17 ENCOUNTER — Other Ambulatory Visit: Payer: Self-pay

## 2022-09-17 MED ORDER — IBSRELA 50 MG PO TABS: 1.0000 | ORAL_TABLET | Freq: Two times a day (BID) | ORAL | 3 refills | Status: DC

## 2022-09-17 MED ORDER — TENAPANOR HCL (CKD) 20 MG PO TABS: 1.0000 | ORAL_TABLET | Freq: Every day | ORAL | 3 refills | Status: DC

## 2022-09-17 NOTE — Addendum Note (Signed)
Addended by: Adela Ports on: 09/17/2022 02:07 PM   Modules accepted: Orders

## 2022-10-15 ENCOUNTER — Ambulatory Visit: Payer: Medicaid Other | Admitting: Physician Assistant

## 2022-10-17 ENCOUNTER — Other Ambulatory Visit: Payer: Self-pay

## 2022-10-20 ENCOUNTER — Encounter: Payer: Self-pay | Admitting: Physician Assistant

## 2022-10-20 ENCOUNTER — Ambulatory Visit: Payer: Medicaid Other | Admitting: Physician Assistant

## 2022-10-20 VITALS — BP 116/72 | HR 108 | Temp 98.0°F | Ht 64.0 in | Wt 209.6 lb

## 2022-10-20 DIAGNOSIS — K581 Irritable bowel syndrome with constipation: Secondary | ICD-10-CM

## 2022-10-20 MED ORDER — AMITIZA 24 MCG PO CAPS: 24.0000 ug | ORAL_CAPSULE | Freq: Two times a day (BID) | ORAL | 5 refills | Status: DC

## 2022-10-20 MED ORDER — IBSRELA 50 MG PO TABS: 1.0000 | ORAL_TABLET | Freq: Two times a day (BID) | ORAL | 3 refills | Status: AC

## 2022-10-20 NOTE — Progress Notes (Signed)
Celso Amy, PA-C [address]  [address]  [place], [place] [address]  Main: [phone]  Fax: [phone]   Primary Care Physician: Alba Cory, MD  Primary Gastroenterologist:  Celso Amy, PA-C / Dr. Wyline Mood    CC: Follow-up constipation and rectal bleeding  HPI: Hayley Smith is a 42 y.o. female presents for follow-up of IBS with constipation.  She saw Dr. Tobi Bastos 09/04/2022 to evaluate constipation alternating with diarrhea.  She is scheduled to have a colonoscopy by Dr. Tobi Bastos 10/30/2022 to evaluate rectal bleeding.  She tried and failed MiraLAX in the past.  She was given samples of Tenapanor Allena Napoleon) at her last visit.  She is currently taking Ibsrela 50 Mg twice daily with great benefit.  She had diarrhea initially, however now she is having normal bowel movement once daily or every other day.  She has not had any recent episodes of rectal bleeding.  She denies abdominal pain.  No GI symptoms or concerns today.  She plans to continue [place].  Lab 07/2022 showed normal hemoglobin 13.8 G.  Current Outpatient Medications  Medication Sig Dispense Refill   atorvastatin (LIPITOR) 80 MG tablet Take 80 mg by mouth daily.     buPROPion (WELLBUTRIN XL) 300 MG 24 hr tablet Take 300 mg by mouth daily.     cyclobenzaprine (FLEXERIL) 5 MG tablet Take 1-2 tablets 3 times daily as needed 20 tablet 0   dexmethylphenidate (FOCALIN XR) 20 MG 24 hr capsule Take 30 mg by mouth daily. 30 mg in AM and 10 mg at lunch time     diazepam (VALIUM) 5 MG tablet Take 5 mg by mouth every 6 (six) hours as needed for anxiety.     doxepin (SINEQUAN) 25 MG capsule Take 25 mg by mouth at bedtime.     losartan (COZAAR) 25 MG tablet Take 25 mg by mouth daily.     methocarbamol (ROBAXIN) 500 MG tablet Take one or 2 tablets 4 times a day as needed for muscle spasms. 30 tablet 0   oxcarbazepine (TRILEPTAL) 600 MG tablet Take 600 mg by mouth 2 (two) times daily.     QUEtiapine (SEROQUEL) 300 MG tablet  Take 600 mg by mouth at bedtime.     rOPINIRole (REQUIP) 0.25 MG tablet Take 0.25 mg by mouth.     Tenapanor HCl (IBSRELA) 50 MG TABS Take 1 tablet by mouth 2 times daily at 12 noon and 4 pm. 180 tablet 3   VENTOLIN HFA 108 (90 Base) MCG/ACT inhaler Inhale 2 puffs into the lungs every 4 (four) hours as needed.     No current facility-administered medications for this visit.    Allergies as of 10/20/2022 - Review Complete 10/20/2022  Allergen Reaction Noted   Gabapentin Other (See Comments) 10/24/2015   Silicone Other (See Comments) 06/05/2022   Tape Other (See Comments) 06/05/2022   Penicillins Rash 03/02/2015    Past Medical History:  Diagnosis Date   ADHD    Anxiety    Arthritis    Bipolar 1 disorder (HCC)    Borderline personality disorder (HCC)    Hypertension     Past Surgical History:  Procedure Laterality Date   ABDOMINAL HYSTERECTOMY     CYST REMOVAL HAND     PLANTAR FASCIA RELEASE Right 06/05/2022   Procedure: PLANTAR FASCIA RELEASE;  Surgeon: Rosetta Posner, DPM;  Location: [facility];  Service: Podiatry;  Laterality: Right;    Review of Systems:    All systems reviewed  and negative except where noted in HPI.   Physical Examination:   BP 116/72   Pulse (!) 108   Temp 98 F (36.7 C)   Ht 5\' 4"  (1.626 m)   Wt 209 lb 9.6 oz (95.1 kg)   BMI 35.98 kg/m   General: Well-nourished, well-developed in no acute distress.  Eyes: No icterus. Conjunctivae pink. Psych: Alert and cooperative, normal mood and affect. No exam performed.  Imaging Studies: No results found.  Assessment and Plan:   Hayley Smith is a 42 y.o. y/o female returns for follow-up of:  1.  Irritable bowel syndrome with constipation; greatly improved on Ibsrela.  Continue Ibsrela 50 Mg twice daily.  Continue high-fiber diet and drink 64 ounces of water daily.  2.  Rectal bleeding; currently resolved  Proceed with colonoscopy 10/30/2022 as scheduled with Dr. Tobi Bastos.    Celso Amy, PA-C  Follow up as needed based on colonoscopy results and GI symptoms.

## 2022-10-24 ENCOUNTER — Encounter: Payer: Self-pay | Admitting: Gastroenterology

## 2022-10-29 ENCOUNTER — Encounter: Payer: Self-pay | Admitting: Gastroenterology

## 2022-10-30 ENCOUNTER — Encounter
Admission: RE | Disposition: A | Discharge status: Home / Self Care | Payer: Self-pay | Source: Home / Self Care | Attending: Gastroenterology

## 2022-10-30 ENCOUNTER — Ambulatory Visit: Payer: Medicaid Other | Admitting: Anesthesiology

## 2022-10-30 ENCOUNTER — Encounter: Payer: Self-pay | Admitting: Gastroenterology

## 2022-10-30 ENCOUNTER — Ambulatory Visit
Admission: RE | Admit: 2022-10-30 | Discharge: 2022-10-30 | Disposition: A | Discharge status: Home / Self Care | Payer: Medicaid Other | Attending: Gastroenterology | Admitting: Gastroenterology

## 2022-10-30 DIAGNOSIS — F909 Attention-deficit hyperactivity disorder, unspecified type: Secondary | ICD-10-CM | POA: Insufficient documentation

## 2022-10-30 DIAGNOSIS — F319 Bipolar disorder, unspecified: Secondary | ICD-10-CM | POA: Insufficient documentation

## 2022-10-30 DIAGNOSIS — Z79899 Other long term (current) drug therapy: Secondary | ICD-10-CM | POA: Diagnosis not present

## 2022-10-30 DIAGNOSIS — I10 Essential (primary) hypertension: Secondary | ICD-10-CM | POA: Diagnosis not present

## 2022-10-30 DIAGNOSIS — F419 Anxiety disorder, unspecified: Secondary | ICD-10-CM | POA: Diagnosis not present

## 2022-10-30 DIAGNOSIS — F17210 Nicotine dependence, cigarettes, uncomplicated: Secondary | ICD-10-CM | POA: Diagnosis not present

## 2022-10-30 DIAGNOSIS — K625 Hemorrhage of anus and rectum: Secondary | ICD-10-CM | POA: Diagnosis present

## 2022-10-30 DIAGNOSIS — K581 Irritable bowel syndrome with constipation: Secondary | ICD-10-CM

## 2022-10-30 HISTORY — PX: COLONOSCOPY WITH PROPOFOL: SHX5780

## 2022-10-30 SURGERY — COLONOSCOPY WITH PROPOFOL
Anesthesia: General

## 2022-10-30 MED ORDER — PROPOFOL 500 MG/50ML IV EMUL
INTRAVENOUS | Status: DC | PRN
  Administered 2022-10-30: 165 ug/kg/min via INTRAVENOUS

## 2022-10-30 MED ORDER — PROPOFOL 1000 MG/100ML IV EMUL
INTRAVENOUS | Status: AC
  Filled 2022-10-30: qty 400

## 2022-10-30 MED ORDER — PROPOFOL 10 MG/ML IV BOLUS
INTRAVENOUS | Status: DC | PRN
Start: 2022-10-30 — End: 2022-10-30
  Administered 2022-10-30: 30 mg via INTRAVENOUS
  Administered 2022-10-30: 70 mg via INTRAVENOUS

## 2022-10-30 MED ORDER — PROPOFOL 1000 MG/100ML IV EMUL
INTRAVENOUS | Status: AC
  Filled 2022-10-30: qty 100

## 2022-10-30 MED ORDER — MIDAZOLAM HCL 2 MG/2ML IJ SOLN
INTRAMUSCULAR | Status: AC
  Filled 2022-10-30: qty 2

## 2022-10-30 MED ORDER — MIDAZOLAM HCL 2 MG/2ML IJ SOLN
INTRAMUSCULAR | Status: DC | PRN
  Administered 2022-10-30: 2 mg via INTRAVENOUS

## 2022-10-30 MED ORDER — SODIUM CHLORIDE 0.9 % IV SOLN: INTRAVENOUS | Status: DC

## 2022-10-30 MED ORDER — LIDOCAINE HCL (CARDIAC) PF 100 MG/5ML IV SOSY
PREFILLED_SYRINGE | INTRAVENOUS | Status: DC | PRN
  Administered 2022-10-30: 100 mg via INTRAVENOUS

## 2022-10-30 NOTE — H&P (Signed)
Wyline Mood, MD [address], [place], [place], [address] [address], [address], [place], [place], [address] Phone: [phone]  Fax: [phone]  Primary Care Physician:  Alba Cory, MD   Pre-Procedure History & Physical: HPI:  Hayley Smith is a 42 y.o. female is here for an colonoscopy.   Past Medical History:  Diagnosis Date   ADHD    Anxiety    Arthritis    Bipolar 1 disorder (HCC)    Borderline personality disorder (HCC)    Hypertension     Past Surgical History:  Procedure Laterality Date   ABDOMINAL HYSTERECTOMY     CYST REMOVAL HAND     PLANTAR FASCIA RELEASE Right 06/05/2022   Procedure: PLANTAR FASCIA RELEASE;  Surgeon: Rosetta Posner, DPM;  Location: [facility];  Service: Podiatry;  Laterality: Right;    Prior to Admission medications   Medication Sig Start Date End Date Taking? Authorizing Provider  atorvastatin (LIPITOR) 80 MG tablet Take 80 mg by mouth daily.   Yes [provider]  buPROPion (WELLBUTRIN XL) 300 MG 24 hr tablet Take 300 mg by mouth daily.   Yes [provider]  dexmethylphenidate (FOCALIN XR) 20 MG 24 hr capsule Take 30 mg by mouth daily. 30 mg in AM and 10 mg at lunch time   Yes [provider]  losartan (COZAAR) 25 MG tablet Take 25 mg by mouth daily.   Yes [provider]  oxcarbazepine (TRILEPTAL) 600 MG tablet Take 600 mg by mouth 2 (two) times daily.   Yes [provider]  Tenapanor HCl (IBSRELA) 50 MG TABS Take 1 tablet by mouth 2 times daily at 12 noon and 4 pm. 10/20/22  Yes Celso Amy, PA-C  cyclobenzaprine (FLEXERIL) 5 MG tablet Take 1-2 tablets 3 times daily as needed 02/08/17   Enid Derry, PA-C  diazepam (VALIUM) 5 MG tablet Take 5 mg by mouth every 6 (six) hours as needed for anxiety.    [provider]  doxepin (SINEQUAN) 25 MG capsule Take 25 mg by mouth at bedtime.    [provider]  methocarbamol (ROBAXIN) 500 MG tablet  Take one or 2 tablets 4 times a day as needed for muscle spasms. 09/19/15   Tommi Rumps, PA-C  QUEtiapine (SEROQUEL) 300 MG tablet Take 600 mg by mouth at bedtime.    [provider]  rOPINIRole (REQUIP) 0.25 MG tablet Take 0.25 mg by mouth. 08/20/22   [provider]  VENTOLIN HFA 108 (90 Base) MCG/ACT inhaler Inhale 2 puffs into the lungs every 4 (four) hours as needed.    [provider]    Allergies as of 09/04/2022 - Review Complete 09/04/2022  Allergen Reaction Noted   Gabapentin Other (See Comments) 10/24/2015   Silicone Other (See Comments) 06/05/2022   Tape Other (See Comments) 06/05/2022   Penicillins Rash 03/02/2015    History reviewed. No pertinent family history.  Social History   Socioeconomic History   Marital status: Married    Spouse name: Not on file   Number of children: Not on file   Years of education: Not on file   Highest education level: Not on file  Occupational History   Not on file  Tobacco Use   Smoking status: Every Day    Current packs/day: 0.50    Average packs/day: 0.5 packs/day for 27.0 years (13.5 ttl pk-yrs)    Types: Cigarettes   Smokeless tobacco: Never  Vaping Use  Vaping status: Never Used  Substance and Sexual Activity   Alcohol use: No   Drug use: No   Sexual activity: Not on file  Other Topics Concern   Not on file  Social History Narrative   Not on file   Social Determinants of Health   Financial Resource Strain: Not on file  Food Insecurity: Not on file  Transportation Needs: Not on file  Physical Activity: Not on file  Stress: Not on file  Social Connections: Not on file  Intimate Partner Violence: Not on file    Review of Systems: See HPI, otherwise negative ROS  Physical Exam: BP (!) 125/97   Pulse 98   Temp (!) 96.6 F (35.9 C) (Temporal)   Resp 16   Ht 5\' 4"  (1.626 m)   Wt 92.5 kg   SpO2 99%   BMI 35.02 kg/m  General:   Alert,  pleasant and cooperative in NAD Head:   Normocephalic and atraumatic. Neck:  Supple; no masses or thyromegaly. Lungs:  Clear throughout to auscultation, normal respiratory effort.    Heart:  +S1, +S2, Regular rate and rhythm, No edema. Abdomen:  Soft, nontender and nondistended. Normal bowel sounds, without guarding, and without rebound.   Neurologic:  Alert and  oriented x4;  grossly normal neurologically.  Impression/Plan: Hayley Smith is here for an colonoscopy to be performed for rectal bleeding  Risks, benefits, limitations, and alternatives regarding  colonoscopy have been reviewed with the patient.  Questions have been answered.  All parties agreeable.   Wyline Mood, MD  10/30/2022, 7:47 AM

## 2022-10-30 NOTE — Op Note (Signed)
[facility] Gastroenterology Patient Name: Hayley Smith Procedure Date: 10/30/2022 7:08 AM MRN: [id number] Account #: [id number] Date of Birth: [birth year] Admit Type: Outpatient Age: 42 Room: [facility] ENDO ROOM 3 Gender: Female Note Status: Finalized Instrument Name: Prentice Docker [id number] Procedure:             Colonoscopy Indications:           Rectal bleeding Providers:             Wyline Mood MD, MD Referring MD:          Onnie Boer. Sowles, MD (Referring MD) Medicines:             Monitored Anesthesia Care Complications:         No immediate complications. Procedure:             Pre-Anesthesia Assessment:                        - Prior to the procedure, a History and Physical was                         performed, and patient medications, allergies and                         sensitivities were reviewed. The patient's tolerance                         of previous anesthesia was reviewed.                        - The risks and benefits of the procedure and the                         sedation options and risks were discussed with the                         patient. All questions were answered and informed                         consent was obtained.                        - ASA Grade Assessment: II - A patient with mild                         systemic disease.                        After obtaining informed consent, the colonoscope was                         passed under direct vision. Throughout the procedure,                         the patient's blood pressure, pulse, and oxygen                         saturations were monitored continuously. The                         Colonoscope was introduced through the  anus and                         advanced to the the cecum, identified by the                         appendiceal orifice. The colonoscopy was performed                         with ease. The patient tolerated the procedure well.                          The quality of the bowel preparation was excellent.                         The ileocecal valve, appendiceal orifice, and rectum                         were photographed. Findings:      The perianal and digital rectal examinations were normal.      The entire examined colon appeared normal on direct and retroflexion       views. Impression:            - The entire examined colon is normal on direct and                         retroflexion views.                        - No specimens collected. Recommendation:        - Discharge patient to home (with escort).                        - Resume previous diet.                        - Continue present medications.                        - Return to GI office as previously scheduled. Procedure Code(s):     --- Professional ---                        [phone], Colonoscopy, flexible; diagnostic, including                         collection of specimen(s) by brushing or washing, when                         performed (separate procedure) Diagnosis Code(s):     --- Professional ---                        K62.5, Hemorrhage of anus and rectum CPT copyright 2022 American [employer] Association. All rights reserved. The codes documented in this report are preliminary and upon coder review may  be revised to meet current compliance requirements. Wyline Mood, MD Wyline Mood MD, MD 10/30/2022 8:06:27 AM This report has been signed electronically. Number of Addenda: 0 Note Initiated On: 10/30/2022 7:08 AM Scope Withdrawal Time: 0 hours 7 minutes 41 seconds  Total Procedure Duration:  0 hours 11 minutes 59 seconds  Estimated Blood Loss:  Estimated blood loss: none.      [facility]

## 2022-10-30 NOTE — Anesthesia Procedure Notes (Signed)
Procedure Name: General with mask airway Date/Time: 10/30/2022 7:56 AM  Performed by: Mohammed Kindle, CRNAPre-anesthesia Checklist: Patient identified, Emergency Drugs available, Suction available, Patient being monitored and Timeout performed Patient Re-evaluated:Patient Re-evaluated prior to induction Oxygen Delivery Method: Simple face mask Induction Type: IV induction Placement Confirmation: positive ETCO2, CO2 detector and breath sounds checked- equal and bilateral Dental Injury: Teeth and Oropharynx as per pre-operative assessment

## 2022-10-30 NOTE — Anesthesia Postprocedure Evaluation (Signed)
Anesthesia Post Note  Patient: Hayley Smith  Procedure(s) Performed: COLONOSCOPY WITH PROPOFOL  Patient location during evaluation: Endoscopy Anesthesia Type: General Level of consciousness: awake and alert Pain management: pain level controlled Vital Signs Assessment: post-procedure vital signs reviewed and stable Respiratory status: spontaneous breathing, nonlabored ventilation, respiratory function stable and patient connected to nasal cannula oxygen Cardiovascular status: blood pressure returned to baseline and stable Postop Assessment: no apparent nausea or vomiting Anesthetic complications: no   No notable events documented.   Last Vitals:  Vitals:   10/30/22 0818 10/30/22 0828  BP: 135/81 133/78  Pulse: 84   Resp: 19   Temp:    SpO2: 100% 100%    Last Pain:  Vitals:   10/30/22 0828  TempSrc:   PainSc: 0-No pain                 Corinda Gubler

## 2022-10-30 NOTE — Anesthesia Preprocedure Evaluation (Signed)
Anesthesia Evaluation  Patient identified by MRN, date of birth, ID band Patient awake    Reviewed: Allergy & Precautions, NPO status , Patient's Chart, lab work & pertinent test results  History of Anesthesia Complications Negative for: history of anesthetic complications  Airway Mallampati: II  TM Distance: >3 FB Neck ROM: Full    Dental  (+) Upper Dentures   Pulmonary neg sleep apnea, neg COPD, Current SmokerPatient did not abstain from smoking.   Pulmonary exam normal breath sounds clear to auscultation       Cardiovascular Exercise Tolerance: Good METShypertension, Pt. on medications (-) CAD and (-) Past MI (-) dysrhythmias  Rhythm:Regular Rate:Normal - Systolic murmurs    Neuro/Psych  PSYCHIATRIC DISORDERS Anxiety  Bipolar Disorder   negative neurological ROS     GI/Hepatic ,neg GERD  ,,(+)     (-) substance abuse    Endo/Other  neg diabetes    Renal/GU negative Renal ROS     Musculoskeletal   Abdominal   Peds  Hematology   Anesthesia Other Findings Past Medical History: No date: ADHD No date: Anxiety No date: Arthritis No date: Bipolar 1 disorder (HCC) No date: Borderline personality disorder (HCC) No date: Hypertension  Reproductive/Obstetrics                             Anesthesia Physical Anesthesia Plan  ASA: 2  Anesthesia Plan: General   Post-op Pain Management: Minimal or no pain anticipated   Induction: Intravenous  PONV Risk Score and Plan: 2 and Propofol infusion, TIVA and Ondansetron  Airway Management Planned: Nasal Cannula  Additional Equipment: None  Intra-op Plan:   Post-operative Plan:   Informed Consent: I have reviewed the patients History and Physical, chart, labs and discussed the procedure including the risks, benefits and alternatives for the proposed anesthesia with the patient or authorized representative who has indicated his/her  understanding and acceptance.     Dental advisory given  Plan Discussed with: CRNA and Surgeon  Anesthesia Plan Comments: (Discussed risks of anesthesia with patient, including possibility of difficulty with spontaneous ventilation under anesthesia necessitating airway intervention, PONV, and rare risks such as cardiac or respiratory or neurological events, and allergic reactions. Discussed the role of CRNA in patient's perioperative care. Patient understands. Patient counseled on benefits of smoking cessation, and increased perioperative risks associated with continued smoking. )       Anesthesia Quick Evaluation

## 2022-10-30 NOTE — Transfer of Care (Signed)
Immediate Anesthesia Transfer of Care Note  Patient: Hayley Smith  Procedure(s) Performed: COLONOSCOPY WITH PROPOFOL  Patient Location: Endoscopy Unit  Anesthesia Type:General  Level of Consciousness: drowsy and patient cooperative  Airway & Oxygen Therapy: Patient Spontanous Breathing and Patient connected to face mask oxygen  Post-op Assessment: Report given to RN and Post -op Vital signs reviewed and stable  Post vital signs: Reviewed and stable  Last Vitals:  Vitals Value Taken Time  BP 123/68 10/30/22 0809  Temp 35.9 C 10/30/22 0808  Pulse 75 10/30/22 0810  Resp 17 10/30/22 0810  SpO2 100 % 10/30/22 0810  Vitals shown include unfiled device data.  Last Pain:  Vitals:   10/30/22 0808  TempSrc: Temporal  PainSc: Asleep         Complications: No notable events documented.

## 2022-10-31 ENCOUNTER — Encounter: Payer: Self-pay | Admitting: Gastroenterology

## 2022-11-13 ENCOUNTER — Ambulatory Visit: Payer: Medicaid Other | Admitting: Gastroenterology

## 2022-12-10 ENCOUNTER — Telehealth: Payer: Self-pay | Admitting: Gastroenterology

## 2022-12-10 NOTE — Telephone Encounter (Signed)
Added insurance card

## 2022-12-16 ENCOUNTER — Other Ambulatory Visit: Payer: Self-pay | Admitting: Podiatry

## 2022-12-16 DIAGNOSIS — M7732 Calcaneal spur, left foot: Secondary | ICD-10-CM

## 2022-12-16 DIAGNOSIS — G8929 Other chronic pain: Secondary | ICD-10-CM

## 2022-12-16 DIAGNOSIS — M722 Plantar fascial fibromatosis: Secondary | ICD-10-CM

## 2022-12-16 DIAGNOSIS — M216X1 Other acquired deformities of right foot: Secondary | ICD-10-CM

## 2022-12-16 DIAGNOSIS — M792 Neuralgia and neuritis, unspecified: Secondary | ICD-10-CM

## 2022-12-25 ENCOUNTER — Encounter: Payer: Self-pay | Admitting: Podiatry

## 2022-12-27 ENCOUNTER — Other Ambulatory Visit: Payer: Medicaid Other

## 2023-01-16 ENCOUNTER — Ambulatory Visit
Admission: RE | Admit: 2023-01-16 | Discharge: 2023-01-16 | Disposition: A | Discharge status: Home / Self Care | Payer: Medicaid Other | Source: Ambulatory Visit | Attending: Podiatry | Admitting: Podiatry

## 2023-01-16 DIAGNOSIS — M792 Neuralgia and neuritis, unspecified: Secondary | ICD-10-CM

## 2023-01-16 DIAGNOSIS — M7732 Calcaneal spur, left foot: Secondary | ICD-10-CM

## 2023-01-16 DIAGNOSIS — M722 Plantar fascial fibromatosis: Secondary | ICD-10-CM

## 2023-01-16 DIAGNOSIS — G8929 Other chronic pain: Secondary | ICD-10-CM

## 2023-01-16 DIAGNOSIS — M216X1 Other acquired deformities of right foot: Secondary | ICD-10-CM

## 2023-02-10 ENCOUNTER — Encounter: Payer: Self-pay | Admitting: Podiatry

## 2023-02-10 ENCOUNTER — Other Ambulatory Visit: Payer: Self-pay | Admitting: Podiatry

## 2023-02-13 NOTE — Anesthesia Preprocedure Evaluation (Signed)
Anesthesia Evaluation  Patient identified by MRN, date of birth, ID band Patient awake    Reviewed: Allergy & Precautions, H&P , NPO status , Patient's Chart, lab work & pertinent test results  Airway Mallampati: II       Dental  (+) Upper Dentures   Pulmonary neg pulmonary ROS, Current Smoker          Cardiovascular hypertension, negative cardio ROS      Neuro/Psych  Headaches PSYCHIATRIC DISORDERS Anxiety  Bipolar Disorder   negative neurological ROS  negative psych ROS   GI/Hepatic negative GI ROS, Neg liver ROS,GERD  ,,  Endo/Other  negative endocrine ROS    Renal/GU negative Renal ROS  negative genitourinary   Musculoskeletal negative musculoskeletal ROS (+) Arthritis ,    Abdominal   Peds negative pediatric ROS (+)  Hematology negative hematology ROS (+)   Anesthesia Other Findings Bipolar 1 disorder (HCC)  Anxiety ADHD  Borderline personality disorder (HCC) Hypertension  Arthritis Headache  GERD (gastroesophageal reflux disease)  Note ropinarole:  do not give phenergan, reglan, haldol, benadryl, nor any other dopamine antagonist.   Reproductive/Obstetrics negative OB ROS                              Anesthesia Physical Anesthesia Plan  ASA: 2  Anesthesia Plan: General   Post-op Pain Management:    Induction: Intravenous  PONV Risk Score and Plan:   Airway Management Planned: LMA  Additional Equipment:   Intra-op Plan:   Post-operative Plan: Extubation in OR  Informed Consent: I have reviewed the patients History and Physical, chart, labs and discussed the procedure including the risks, benefits and alternatives for the proposed anesthesia with the patient or authorized representative who has indicated his/her understanding and acceptance.     Dental Advisory Given  Plan Discussed with: Anesthesiologist, CRNA and Surgeon  Anesthesia Plan Comments:  (Patient consented for risks of anesthesia including but not limited to:  - adverse reactions to medications - damage to eyes, teeth, lips or other oral mucosa - nerve damage due to positioning  - sore throat or hoarseness - Damage to heart, brain, nerves, lungs, other parts of body or loss of life  Patient voiced understanding and assent.)         Anesthesia Quick Evaluation

## 2023-02-17 NOTE — Discharge Instructions (Signed)

## 2023-02-19 ENCOUNTER — Encounter: Payer: Self-pay | Admitting: Podiatry

## 2023-02-19 ENCOUNTER — Encounter
Admission: RE | Disposition: A | Discharge status: Home / Self Care | Payer: Self-pay | Source: Home / Self Care | Attending: Podiatry

## 2023-02-19 ENCOUNTER — Ambulatory Visit: Payer: Self-pay

## 2023-02-19 ENCOUNTER — Other Ambulatory Visit: Payer: Self-pay

## 2023-02-19 ENCOUNTER — Ambulatory Visit: Payer: Self-pay | Admitting: Anesthesiology

## 2023-02-19 ENCOUNTER — Ambulatory Visit: Payer: Medicaid Other | Admitting: Anesthesiology

## 2023-02-19 ENCOUNTER — Ambulatory Visit
Admission: RE | Admit: 2023-02-19 | Discharge: 2023-02-19 | Disposition: A | Discharge status: Home / Self Care | Payer: Medicaid Other | Attending: Podiatry | Admitting: Podiatry

## 2023-02-19 DIAGNOSIS — M7732 Calcaneal spur, left foot: Secondary | ICD-10-CM | POA: Diagnosis not present

## 2023-02-19 DIAGNOSIS — F17210 Nicotine dependence, cigarettes, uncomplicated: Secondary | ICD-10-CM | POA: Insufficient documentation

## 2023-02-19 DIAGNOSIS — K219 Gastro-esophageal reflux disease without esophagitis: Secondary | ICD-10-CM | POA: Diagnosis not present

## 2023-02-19 DIAGNOSIS — M722 Plantar fascial fibromatosis: Secondary | ICD-10-CM

## 2023-02-19 DIAGNOSIS — F319 Bipolar disorder, unspecified: Secondary | ICD-10-CM | POA: Diagnosis not present

## 2023-02-19 DIAGNOSIS — F419 Anxiety disorder, unspecified: Secondary | ICD-10-CM | POA: Insufficient documentation

## 2023-02-19 HISTORY — DX: Restless legs syndrome: G25.81

## 2023-02-19 HISTORY — PX: BONE EXCISION: SHX6730

## 2023-02-19 HISTORY — PX: PLANTAR FASCIA RELEASE: SHX2239

## 2023-02-19 HISTORY — DX: Gastro-esophageal reflux disease without esophagitis: K21.9

## 2023-02-19 HISTORY — DX: Headache, unspecified: R51.9

## 2023-02-19 SURGERY — RELEASE, FASCIA, PLANTAR
Anesthesia: General | Laterality: Left

## 2023-02-19 MED ORDER — ASPIRIN 81 MG PO TBEC: 81.0000 mg | DELAYED_RELEASE_TABLET | Freq: Two times a day (BID) | ORAL | 0 refills | Status: AC

## 2023-02-19 MED ORDER — CEFAZOLIN SODIUM-DEXTROSE 2-4 GM/100ML-% IV SOLN
2.0000 g | INTRAVENOUS | Status: AC
  Administered 2023-02-19: 2 g via INTRAVENOUS

## 2023-02-19 MED ORDER — FENTANYL CITRATE (PF) 100 MCG/2ML IJ SOLN
INTRAMUSCULAR | Status: DC | PRN
  Administered 2023-02-19 (×2): 25 ug via INTRAVENOUS

## 2023-02-19 MED ORDER — ONDANSETRON HCL 4 MG/2ML IJ SOLN
INTRAMUSCULAR | Status: DC | PRN
  Administered 2023-02-19: 4 mg via INTRAVENOUS

## 2023-02-19 MED ORDER — LACTATED RINGERS IV SOLN: INTRAVENOUS | Status: DC | PRN

## 2023-02-19 MED ORDER — CEFAZOLIN SODIUM-DEXTROSE 2-3 GM-%(50ML) IV SOLR
INTRAVENOUS | Status: AC
  Filled 2023-02-19: qty 50

## 2023-02-19 MED ORDER — BUPIVACAINE LIPOSOME 1.3 % IJ SUSP
INTRAMUSCULAR | Status: DC | PRN
  Administered 2023-02-19: 20 mL

## 2023-02-19 MED ORDER — PHENYLEPHRINE HCL (PRESSORS) 10 MG/ML IV SOLN
INTRAVENOUS | Status: DC | PRN
  Administered 2023-02-19 (×2): 80 ug via INTRAVENOUS

## 2023-02-19 MED ORDER — PROPOFOL 10 MG/ML IV BOLUS
INTRAVENOUS | Status: AC
  Filled 2023-02-19: qty 20

## 2023-02-19 MED ORDER — ONDANSETRON HCL 4 MG PO TABS: 4.0000 mg | ORAL_TABLET | Freq: Three times a day (TID) | ORAL | 0 refills | Status: AC | PRN

## 2023-02-19 MED ORDER — DEXAMETHASONE SODIUM PHOSPHATE 4 MG/ML IJ SOLN
INTRAMUSCULAR | Status: AC
  Filled 2023-02-19: qty 2

## 2023-02-19 MED ORDER — FENTANYL CITRATE (PF) 100 MCG/2ML IJ SOLN
INTRAMUSCULAR | Status: AC
  Filled 2023-02-19: qty 2

## 2023-02-19 MED ORDER — LIDOCAINE HCL (PF) 2 % IJ SOLN
INTRAMUSCULAR | Status: AC
  Filled 2023-02-19: qty 5

## 2023-02-19 MED ORDER — DEXAMETHASONE SODIUM PHOSPHATE 4 MG/ML IJ SOLN
INTRAMUSCULAR | Status: DC | PRN
  Administered 2023-02-19: 8 mg via INTRAVENOUS

## 2023-02-19 MED ORDER — LIDOCAINE HCL (CARDIAC) PF 100 MG/5ML IV SOSY
PREFILLED_SYRINGE | INTRAVENOUS | Status: DC | PRN
  Administered 2023-02-19: 100 mg via INTRATRACHEAL

## 2023-02-19 MED ORDER — ONDANSETRON HCL 4 MG/2ML IJ SOLN
INTRAMUSCULAR | Status: AC
  Filled 2023-02-19: qty 2

## 2023-02-19 MED ORDER — OXYCODONE-ACETAMINOPHEN 5-325 MG PO TABS: 1.0000 | ORAL_TABLET | Freq: Four times a day (QID) | ORAL | 0 refills | Status: AC | PRN

## 2023-02-19 MED ORDER — PROPOFOL 10 MG/ML IV BOLUS
INTRAVENOUS | Status: DC | PRN
  Administered 2023-02-19: 200 mg via INTRAVENOUS

## 2023-02-19 MED ORDER — BUPIVACAINE LIPOSOME 1.3 % IJ SUSP
INTRAMUSCULAR | Status: AC
  Filled 2023-02-19: qty 10

## 2023-02-19 MED ORDER — MIDAZOLAM HCL 5 MG/5ML IJ SOLN
INTRAMUSCULAR | Status: DC | PRN
  Administered 2023-02-19: 2 mg via INTRAVENOUS

## 2023-02-19 MED ORDER — 0.9 % SODIUM CHLORIDE (POUR BTL) OPTIME
TOPICAL | Status: DC | PRN
  Administered 2023-02-19: 1000 mL

## 2023-02-19 MED ORDER — MIDAZOLAM HCL 2 MG/2ML IJ SOLN
INTRAMUSCULAR | Status: AC
  Filled 2023-02-19: qty 2

## 2023-02-19 SURGICAL SUPPLY — 38 items
BNDG COHESIVE 4X5 TAN STRL LF (GAUZE/BANDAGES/DRESSINGS) ×1 IMPLANT
BNDG ELASTIC 4X5.8 VLCR NS LF (GAUZE/BANDAGES/DRESSINGS) ×2 IMPLANT
BNDG ESMARK 4X12 TAN STRL LF (GAUZE/BANDAGES/DRESSINGS) ×1 IMPLANT
BNDG GAUZE DERMACEA FLUFF 4 (GAUZE/BANDAGES/DRESSINGS) ×1 IMPLANT
BOOT STEPPER DURA MED (SOFTGOODS) IMPLANT
CANISTER SUCT 1200ML W/VALVE (MISCELLANEOUS) ×1 IMPLANT
COVER LIGHT HANDLE UNIVERSAL (MISCELLANEOUS) ×2 IMPLANT
CUFF TOURN SGL QUICK 18X4 (TOURNIQUET CUFF) IMPLANT
CUFF TOURN SGL QUICK 24 (TOURNIQUET CUFF) ×1
CUFF TRNQT CYL 24X4X16.5-23 (TOURNIQUET CUFF) IMPLANT
DRAPE FLUOR MINI C-ARM 54X84 (DRAPES) IMPLANT
DURAPREP 26ML APPLICATOR (WOUND CARE) ×1 IMPLANT
ELECT REM PT RETURN 9FT ADLT (ELECTROSURGICAL) ×1
ELECTRODE REM PT RTRN 9FT ADLT (ELECTROSURGICAL) ×1 IMPLANT
GAUZE SPONGE 4X4 12PLY STRL (GAUZE/BANDAGES/DRESSINGS) ×1 IMPLANT
GAUZE XEROFORM 1X8 LF (GAUZE/BANDAGES/DRESSINGS) ×1 IMPLANT
GAUZE XEROFORM 5X9 LF (GAUZE/BANDAGES/DRESSINGS) IMPLANT
GLOVE BIOGEL PI IND STRL 7.5 (GLOVE) ×1 IMPLANT
GLOVE SURG SS PI 7.0 STRL IVOR (GLOVE) ×1 IMPLANT
GOWN STRL REUS W/ TWL LRG LVL3 (GOWN DISPOSABLE) ×2 IMPLANT
GOWN STRL REUS W/TWL LRG LVL3 (GOWN DISPOSABLE) ×2
IV NS 250ML (IV SOLUTION) ×1
IV NS 250ML BAXH (IV SOLUTION) ×1 IMPLANT
K-WIRE DBL END TROCAR 6X.062 (WIRE) ×1
KIT CARPAL TUNNEL (MISCELLANEOUS) ×2
KIT ESCP INSRT D SLOT CANN KN (MISCELLANEOUS) ×1 IMPLANT
KIT PRC PRB RTRGD 3ANG KNF HND (MISCELLANEOUS) ×1 IMPLANT
KIT TURNOVER KIT A (KITS) ×1 IMPLANT
KWIRE DBL END TROCAR 6X.062 (WIRE) IMPLANT
NS IRRIG 500ML POUR BTL (IV SOLUTION) ×1 IMPLANT
PACK EXTREMITY ARMC (MISCELLANEOUS) ×1 IMPLANT
PADDING CAST BLEND 4X4 NS (MISCELLANEOUS) ×3 IMPLANT
RASP SM TEAR CROSS CUT (RASP) IMPLANT
SPLINT CAST 1 STEP 4X30 (MISCELLANEOUS) ×1 IMPLANT
STOCKINETTE IMPERVIOUS LG (DRAPES) ×1 IMPLANT
SUT ETHILON 3-0 (SUTURE) ×1 IMPLANT
SUT NYLON 3 0 (SUTURE) IMPLANT
WAND TENDON TOPAZ 0 ANGL (MISCELLANEOUS) IMPLANT

## 2023-02-19 NOTE — Anesthesia Procedure Notes (Addendum)
Procedure Name: LMA Insertion Date/Time: 02/19/2023 9:07 AM  Performed by: Irving Burton, CRNAPre-anesthesia Checklist: Patient identified, Patient being monitored, Timeout performed, Emergency Drugs available and Suction available Patient Re-evaluated:Patient Re-evaluated prior to induction Oxygen Delivery Method: Circle system utilized Preoxygenation: Pre-oxygenation with 100% oxygen Induction Type: IV induction Ventilation: Mask ventilation without difficulty LMA: LMA inserted LMA Size: 4.0 Tube type: Oral Number of attempts: 1 Placement Confirmation: positive ETCO2 and breath sounds checked- equal and bilateral Tube secured with: Tape Dental Injury: Teeth and Oropharynx as per pre-operative assessment

## 2023-02-19 NOTE — Transfer of Care (Signed)
Immediate Anesthesia Transfer of Care Note  Patient: Hayley Smith  Procedure(s) Performed: PLANTAR FASCIA RELEASE (Left) 28120 - PARTIAL CALANECTOMY (Left)  Patient Location: PACU  Anesthesia Type: General  Level of Consciousness: awake, alert  and patient cooperative  Airway and Oxygen Therapy: Patient Spontanous Breathing and Patient connected to supplemental oxygen  Post-op Assessment: Post-op Vital signs reviewed, Patient's Cardiovascular Status Stable, Respiratory Function Stable, Patent Airway and No signs of Nausea or vomiting  Post-op Vital Signs: Reviewed and stable  Complications: No notable events documented.

## 2023-02-19 NOTE — Anesthesia Postprocedure Evaluation (Signed)
Anesthesia Post Note  Patient: Hayley Smith  Procedure(s) Performed: PLANTAR FASCIA RELEASE (Left) 28120 - PARTIAL CALANECTOMY (Left)  Patient location during evaluation: PACU Anesthesia Type: General Level of consciousness: awake and alert Pain management: pain level controlled Vital Signs Assessment: post-procedure vital signs reviewed and stable Respiratory status: spontaneous breathing, nonlabored ventilation, respiratory function stable and patient connected to nasal cannula oxygen Cardiovascular status: blood pressure returned to baseline and stable Postop Assessment: no apparent nausea or vomiting Anesthetic complications: no   No notable events documented.   Last Vitals:  Vitals:   02/19/23 1030 02/19/23 1035  BP: 104/72   Pulse: 78 83  Resp: 17 15  Temp: (!) 36.1 C   SpO2: 96% 95%    Last Pain:  Vitals:   02/19/23 1030  TempSrc:   PainSc: 0-No pain                 Markayla Reichart C Parneet Glantz

## 2023-02-19 NOTE — H&P (Signed)
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  The various methods of treatment have been discussed with the patient.  No guarantees were given.  After consideration of risks, benefits and other options for treatment, the patient has consented to surgery.  I have reviewed the patients' chart and labs.    PROCEDURE: LEFT FOOT ENDOSCOPIC PLANTAR FASCIOTOMY WITH TOPAZ MICRODEBRIDEMENT HEEL SPUR RESECTION LEFT FOOT  A history and physical examination was performed in my office.  The patient was reexamined.  There have been no changes to this history and physical examination.  Rosetta Posner, DPM

## 2023-02-19 NOTE — Op Note (Signed)
PODIATRY / FOOT AND ANKLE SURGERY OPERATIVE REPORT    SURGEON: Rosetta Posner, DPM  PRE-OPERATIVE DIAGNOSIS:  1.  Left plantar fasciitis 2.  Left calcaneal heel spur  POST-OPERATIVE DIAGNOSIS: Same  PROCEDURE(S): Left endoscopic plantar fasciotomy with Topaz micro debridement Left heel spur resection  HEMOSTASIS: Left ankle tourniquet  ANESTHESIA: MAC  ESTIMATED BLOOD LOSS: 10 cc  FINDING(S): 1.  Left plantar calcaneal heel spur with increased thickness of the plantar fascia  PATHOLOGY/SPECIMEN(S): None  INDICATIONS:   Hayley Smith is a 42 y.o. female who presents with chronic left heel pain.  Patient also had right heel pain in the past and was treated conservatively for both issues with steroid injections, changes in shoes, physical therapy, RICE therapy with use of NSAIDs/Tylenol as needed, night splints but continued out of pain discomfort.  Patient subsequently underwent endoscopic plantar fasciotomy with Topaz micro debridement to the right lower extremity and did well with this now presents today for the left side.  All treatment options were discussed with the patient both conservative and surgical attempts at correction include potential risks and complications at this time patient is elected for procedure consisting of left endoscopic plantar fasciotomy with Topaz micro debridement and heel spur resection.  No guarantees given.  Consent obtained prior to procedure.  DESCRIPTION: After obtaining full informed written consent, the patient was brought back to the operating room and placed supine upon the operating table.  The patient received IV antibiotics prior to induction.  After obtaining adequate anesthesia, the patient was prepped and draped in the standard fashion.  A preoperative block was performed around the left heel both laterally and medially as well as the tarsal tunnel area with 20 cc of a one-to-one mix of quarter percent Marcaine plain and Exparel.  An  Esmarch bandage was used to exsanguinate the left lower extremity the pneumatic ankle tourniquet was inflated.  Attention was then directed to the left medial heel where a small incision was made slightly distal to the origin point of the plantar fascia along the same course while holding the toe in a maximally dorsiflexed position to visualize the medial band.  The incision was deepened through the subcutaneous tissues utilizing sharp and blunt dissection and care was taken to identify and retract all vital neurovascular structures no venous contributories were cauterized as necessary.  At this time a spatula type of instrument was then used to pass underneath the plantar fascia through the subcutaneous fat to the lateral heel.  Once this area was able to be visualized a small percutaneous incision was made over the lateral heel and the blunt instrumentation was then used to push through the lateral heel skin.  At this time the blunt instrumentation was removed.  The obturator and cannula was then placed with the appropriate orientation from medial to lateral.  Cotton tip applicators then passed through the cannula after the obturator was removed removing any debris.  The scope was then introduced into the lateral aspect of the cannula through the lateral heel assessing the medial, central, and lateral plantar fascia.  The plantar fascia was identified and noted to be glistening but very thick.  At this time the triangular hook blade was then placed through the medial portal and two thirds of the central band and all of the medial band of the plantar fascia were resected.  Once resected the flexor digitorum brevis muscle belly was able to be visualized indicating successful release of the plantar fascia.  The instrumentation was  removed and the cotton tip applicators were placed through the cannula again and the scope was then introduced to assess the correction.  A blunt probe was then placed through the medial  portal and inspected the area and there did not appear to be any further attachments connected at the plantar fascia at the two thirds medial central band as well as all of the medial band indicating successful release.  This instrumentation was removed but the cannula was left intact in the area was flushed with copious amounts normal sterile saline.  The cannula was then removed.  Attention was then directed to the medial heel where blunt dissection was continued down to the heel spur through the same small incision.  This was then assessed under fluoroscopic guidance and visualized.  The rasp was then placed through the medial portal and placed onto the calcaneal heel spur under fluoroscopic guidance.  Under fluoroscopic guidance the medial heel spur was resected.  There appeared to be a normal/smooth contour to the area after resection was performed.  The surgical site was flushed with copious amounts normal sterile saline.  Final C-arm imaging was then taken showing successful resection of the plantar calcaneal heel spur.  Attention was then directed to the plantar heel where a 5 x 5 grade was made along the medial and central band of the plantar fascia at the origin point of the plantar fascia and slightly distal.  After the grid was made a 0.062 K wire was then used to create small pilot holes for the Topaz micro debridement instrumentation.  The Topaz instrumentation was placed through each hole and used in 3 different directions in each hole completing the micro debridement of the plantar fascia.  The incisions both medially and laterally at the heel were then reapproximated well coapted with 3-0 nylon.  A postoperative dressing was then applied consisting of Xeroform to the plantar heel and heel incisions followed by 4 x 4 gauze, gauze roll, Ace wrap, cam boot.  The pneumatic ankle tourniquet was released prior to placing the cam boot and a prompt hyperemic response was noted all digits left foot.   Patient was transferred to the recovery in vital signs stable vascular status intact to all toes left foot.  Following a period of postoperative monitoring the patient will will be discharged home with the appropriate orders, instructions, and medications.  Patient will follow-up in clinic within 1 week of surgical date and is to be nonweightbearing at all times left lower extremity and is to keep the cam boot on for the most part at all times as well as will be acting as a cast or splint.  COMPLICATIONS: None  CONDITION: Good, stable  Rosetta Posner, DPM

## 2023-02-20 ENCOUNTER — Encounter: Payer: Self-pay | Admitting: Podiatry

## 2023-07-31 ENCOUNTER — Other Ambulatory Visit: Payer: Self-pay | Admitting: Family Medicine

## 2023-07-31 DIAGNOSIS — Z1231 Encounter for screening mammogram for malignant neoplasm of breast: Secondary | ICD-10-CM
